# Patient Record
Sex: Female | Born: 1935 | Race: White | Hispanic: No | Smoking: Never smoker
Health system: Southern US, Community
[De-identification: ages and names within clinical notes are randomized; demographics above are authoritative.]

## PROBLEM LIST (undated history)

## (undated) DIAGNOSIS — R0609 Other forms of dyspnea: Secondary | ICD-10-CM

## (undated) DIAGNOSIS — Z9889 Other specified postprocedural states: Secondary | ICD-10-CM

## (undated) DIAGNOSIS — M1990 Unspecified osteoarthritis, unspecified site: Secondary | ICD-10-CM

## (undated) DIAGNOSIS — E785 Hyperlipidemia, unspecified: Secondary | ICD-10-CM

## (undated) DIAGNOSIS — I119 Hypertensive heart disease without heart failure: Secondary | ICD-10-CM

## (undated) DIAGNOSIS — Z862 Personal history of diseases of the blood and blood-forming organs and certain disorders involving the immune mechanism: Secondary | ICD-10-CM

## (undated) DIAGNOSIS — T8859XA Other complications of anesthesia, initial encounter: Secondary | ICD-10-CM

## (undated) DIAGNOSIS — I2510 Atherosclerotic heart disease of native coronary artery without angina pectoris: Secondary | ICD-10-CM

## (undated) DIAGNOSIS — F325 Major depressive disorder, single episode, in full remission: Secondary | ICD-10-CM

## (undated) DIAGNOSIS — R002 Palpitations: Secondary | ICD-10-CM

## (undated) DIAGNOSIS — T4145XA Adverse effect of unspecified anesthetic, initial encounter: Secondary | ICD-10-CM

## (undated) DIAGNOSIS — A4901 Methicillin susceptible Staphylococcus aureus infection, unspecified site: Secondary | ICD-10-CM

## (undated) DIAGNOSIS — I712 Thoracic aortic aneurysm, without rupture: Secondary | ICD-10-CM

## (undated) DIAGNOSIS — I10 Essential (primary) hypertension: Secondary | ICD-10-CM

## (undated) DIAGNOSIS — R112 Nausea with vomiting, unspecified: Secondary | ICD-10-CM

## (undated) DIAGNOSIS — R55 Syncope and collapse: Secondary | ICD-10-CM

## (undated) DIAGNOSIS — C4491 Basal cell carcinoma of skin, unspecified: Secondary | ICD-10-CM

## (undated) DIAGNOSIS — F419 Anxiety disorder, unspecified: Secondary | ICD-10-CM

## (undated) HISTORY — DX: Hypertensive heart disease without heart failure: I11.9

## (undated) HISTORY — DX: Palpitations: R00.2

## (undated) HISTORY — PX: BASAL CELL CARCINOMA EXCISION: SHX1214

## (undated) HISTORY — DX: Syncope and collapse: R55

## (undated) HISTORY — PX: BLADDER SUSPENSION: SHX72

## (undated) HISTORY — DX: Atherosclerotic heart disease of native coronary artery without angina pectoris: I25.10

## (undated) HISTORY — PX: OTHER SURGICAL HISTORY: SHX169

## (undated) HISTORY — PX: UPPER GI ENDOSCOPY: SHX6162

## (undated) HISTORY — DX: Anxiety disorder, unspecified: F41.9

## (undated) HISTORY — PX: COLONOSCOPY: SHX174

## (undated) HISTORY — PX: NASAL SINUS SURGERY: SHX719

## (undated) HISTORY — DX: Other forms of dyspnea: R06.09

## (undated) HISTORY — DX: Thoracic aortic aneurysm, without rupture: I71.2

## (undated) HISTORY — DX: Essential (primary) hypertension: I10

## (undated) HISTORY — PX: ABDOMINAL HYSTERECTOMY: SHX81

## (undated) HISTORY — DX: Major depressive disorder, single episode, in full remission: F32.5

## (undated) HISTORY — DX: Hyperlipidemia, unspecified: E78.5

---
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
## 2005-04-04 HISTORY — PX: FRACTURE SURGERY: SHX138

## 2013-01-03 DIAGNOSIS — I712 Thoracic aortic aneurysm, without rupture, unspecified: Secondary | ICD-10-CM

## 2013-01-03 HISTORY — DX: Thoracic aortic aneurysm, without rupture, unspecified: I71.20

## 2013-01-03 HISTORY — DX: Thoracic aortic aneurysm, without rupture: I71.2

## 2015-05-20 DIAGNOSIS — L821 Other seborrheic keratosis: Secondary | ICD-10-CM | POA: Diagnosis not present

## 2015-05-20 DIAGNOSIS — C44319 Basal cell carcinoma of skin of other parts of face: Secondary | ICD-10-CM | POA: Diagnosis not present

## 2015-05-20 DIAGNOSIS — C44529 Squamous cell carcinoma of skin of other part of trunk: Secondary | ICD-10-CM | POA: Diagnosis not present

## 2015-05-20 DIAGNOSIS — L739 Follicular disorder, unspecified: Secondary | ICD-10-CM | POA: Diagnosis not present

## 2015-05-20 DIAGNOSIS — D225 Melanocytic nevi of trunk: Secondary | ICD-10-CM | POA: Diagnosis not present

## 2015-05-20 DIAGNOSIS — L820 Inflamed seborrheic keratosis: Secondary | ICD-10-CM | POA: Diagnosis not present

## 2015-05-21 DIAGNOSIS — I119 Hypertensive heart disease without heart failure: Secondary | ICD-10-CM

## 2015-05-21 DIAGNOSIS — E785 Hyperlipidemia, unspecified: Secondary | ICD-10-CM

## 2015-05-21 DIAGNOSIS — F419 Anxiety disorder, unspecified: Secondary | ICD-10-CM | POA: Insufficient documentation

## 2015-05-21 DIAGNOSIS — F325 Major depressive disorder, single episode, in full remission: Secondary | ICD-10-CM

## 2015-05-21 DIAGNOSIS — I2510 Atherosclerotic heart disease of native coronary artery without angina pectoris: Secondary | ICD-10-CM

## 2015-05-21 HISTORY — DX: Hyperlipidemia, unspecified: E78.5

## 2015-05-21 HISTORY — DX: Anxiety disorder, unspecified: F41.9

## 2015-05-21 HISTORY — DX: Hypertensive heart disease without heart failure: I11.9

## 2015-05-21 HISTORY — DX: Atherosclerotic heart disease of native coronary artery without angina pectoris: I25.10

## 2015-05-21 HISTORY — DX: Major depressive disorder, single episode, in full remission: F32.5

## 2015-05-22 DIAGNOSIS — R220 Localized swelling, mass and lump, head: Secondary | ICD-10-CM | POA: Diagnosis not present

## 2015-05-22 DIAGNOSIS — R51 Headache: Secondary | ICD-10-CM | POA: Diagnosis not present

## 2015-06-04 DIAGNOSIS — J320 Chronic maxillary sinusitis: Secondary | ICD-10-CM | POA: Diagnosis not present

## 2015-06-04 DIAGNOSIS — J342 Deviated nasal septum: Secondary | ICD-10-CM | POA: Diagnosis not present

## 2015-06-04 DIAGNOSIS — R220 Localized swelling, mass and lump, head: Secondary | ICD-10-CM | POA: Diagnosis not present

## 2015-06-17 DIAGNOSIS — J320 Chronic maxillary sinusitis: Secondary | ICD-10-CM | POA: Diagnosis not present

## 2015-06-29 DIAGNOSIS — J343 Hypertrophy of nasal turbinates: Secondary | ICD-10-CM | POA: Diagnosis not present

## 2015-06-29 DIAGNOSIS — J320 Chronic maxillary sinusitis: Secondary | ICD-10-CM | POA: Diagnosis not present

## 2015-06-29 DIAGNOSIS — J342 Deviated nasal septum: Secondary | ICD-10-CM | POA: Diagnosis not present

## 2015-07-01 DIAGNOSIS — J320 Chronic maxillary sinusitis: Secondary | ICD-10-CM | POA: Diagnosis not present

## 2015-07-01 DIAGNOSIS — R220 Localized swelling, mass and lump, head: Secondary | ICD-10-CM | POA: Diagnosis not present

## 2015-07-24 DIAGNOSIS — Z01810 Encounter for preprocedural cardiovascular examination: Secondary | ICD-10-CM | POA: Diagnosis not present

## 2015-07-24 DIAGNOSIS — Z01818 Encounter for other preprocedural examination: Secondary | ICD-10-CM | POA: Diagnosis not present

## 2015-07-24 DIAGNOSIS — I10 Essential (primary) hypertension: Secondary | ICD-10-CM | POA: Diagnosis not present

## 2015-07-29 DIAGNOSIS — I712 Thoracic aortic aneurysm, without rupture: Secondary | ICD-10-CM | POA: Diagnosis not present

## 2015-07-29 DIAGNOSIS — I2510 Atherosclerotic heart disease of native coronary artery without angina pectoris: Secondary | ICD-10-CM | POA: Diagnosis not present

## 2015-07-29 DIAGNOSIS — I119 Hypertensive heart disease without heart failure: Secondary | ICD-10-CM | POA: Diagnosis not present

## 2015-08-03 DIAGNOSIS — I719 Aortic aneurysm of unspecified site, without rupture: Secondary | ICD-10-CM | POA: Diagnosis not present

## 2015-08-03 DIAGNOSIS — I714 Abdominal aortic aneurysm, without rupture: Secondary | ICD-10-CM | POA: Diagnosis not present

## 2015-09-04 DIAGNOSIS — J320 Chronic maxillary sinusitis: Secondary | ICD-10-CM | POA: Diagnosis not present

## 2015-09-04 DIAGNOSIS — R0981 Nasal congestion: Secondary | ICD-10-CM | POA: Diagnosis not present

## 2015-09-04 DIAGNOSIS — J343 Hypertrophy of nasal turbinates: Secondary | ICD-10-CM | POA: Diagnosis not present

## 2015-09-04 DIAGNOSIS — J342 Deviated nasal septum: Secondary | ICD-10-CM | POA: Diagnosis not present

## 2015-09-07 DIAGNOSIS — I10 Essential (primary) hypertension: Secondary | ICD-10-CM | POA: Diagnosis not present

## 2015-09-07 DIAGNOSIS — I712 Thoracic aortic aneurysm, without rupture: Secondary | ICD-10-CM | POA: Diagnosis not present

## 2015-09-07 DIAGNOSIS — Z298 Encounter for other specified prophylactic measures: Secondary | ICD-10-CM | POA: Diagnosis not present

## 2015-09-29 DIAGNOSIS — J320 Chronic maxillary sinusitis: Secondary | ICD-10-CM | POA: Diagnosis not present

## 2015-09-29 DIAGNOSIS — Z79899 Other long term (current) drug therapy: Secondary | ICD-10-CM | POA: Diagnosis not present

## 2015-09-29 DIAGNOSIS — Z7982 Long term (current) use of aspirin: Secondary | ICD-10-CM | POA: Diagnosis not present

## 2015-09-29 DIAGNOSIS — J342 Deviated nasal septum: Secondary | ICD-10-CM | POA: Diagnosis not present

## 2015-09-29 DIAGNOSIS — E7800 Pure hypercholesterolemia, unspecified: Secondary | ICD-10-CM | POA: Diagnosis not present

## 2015-09-29 DIAGNOSIS — R0981 Nasal congestion: Secondary | ICD-10-CM | POA: Diagnosis not present

## 2015-09-29 DIAGNOSIS — I10 Essential (primary) hypertension: Secondary | ICD-10-CM | POA: Diagnosis not present

## 2015-09-29 DIAGNOSIS — J3489 Other specified disorders of nose and nasal sinuses: Secondary | ICD-10-CM | POA: Diagnosis not present

## 2015-09-29 DIAGNOSIS — J343 Hypertrophy of nasal turbinates: Secondary | ICD-10-CM | POA: Diagnosis not present

## 2015-09-30 DIAGNOSIS — Z09 Encounter for follow-up examination after completed treatment for conditions other than malignant neoplasm: Secondary | ICD-10-CM | POA: Diagnosis not present

## 2015-10-07 DIAGNOSIS — J329 Chronic sinusitis, unspecified: Secondary | ICD-10-CM | POA: Diagnosis not present

## 2015-10-14 DIAGNOSIS — J329 Chronic sinusitis, unspecified: Secondary | ICD-10-CM | POA: Diagnosis not present

## 2015-10-19 DIAGNOSIS — J320 Chronic maxillary sinusitis: Secondary | ICD-10-CM | POA: Diagnosis not present

## 2015-10-27 DIAGNOSIS — Z1231 Encounter for screening mammogram for malignant neoplasm of breast: Secondary | ICD-10-CM | POA: Diagnosis not present

## 2016-01-01 DIAGNOSIS — Z79899 Other long term (current) drug therapy: Secondary | ICD-10-CM | POA: Diagnosis not present

## 2016-01-01 DIAGNOSIS — Z23 Encounter for immunization: Secondary | ICD-10-CM | POA: Diagnosis not present

## 2016-01-01 DIAGNOSIS — Z0001 Encounter for general adult medical examination with abnormal findings: Secondary | ICD-10-CM | POA: Diagnosis not present

## 2016-01-01 DIAGNOSIS — R21 Rash and other nonspecific skin eruption: Secondary | ICD-10-CM | POA: Diagnosis not present

## 2016-01-01 DIAGNOSIS — I119 Hypertensive heart disease without heart failure: Secondary | ICD-10-CM | POA: Diagnosis not present

## 2016-01-01 DIAGNOSIS — D492 Neoplasm of unspecified behavior of bone, soft tissue, and skin: Secondary | ICD-10-CM | POA: Diagnosis not present

## 2016-01-05 DIAGNOSIS — J0100 Acute maxillary sinusitis, unspecified: Secondary | ICD-10-CM | POA: Diagnosis not present

## 2016-01-05 DIAGNOSIS — J029 Acute pharyngitis, unspecified: Secondary | ICD-10-CM | POA: Diagnosis not present

## 2016-01-21 DIAGNOSIS — R51 Headache: Secondary | ICD-10-CM | POA: Diagnosis not present

## 2016-01-21 DIAGNOSIS — Z9889 Other specified postprocedural states: Secondary | ICD-10-CM | POA: Diagnosis not present

## 2016-01-21 DIAGNOSIS — J329 Chronic sinusitis, unspecified: Secondary | ICD-10-CM | POA: Diagnosis not present

## 2016-02-12 DIAGNOSIS — I10 Essential (primary) hypertension: Secondary | ICD-10-CM | POA: Diagnosis not present

## 2016-02-12 DIAGNOSIS — D485 Neoplasm of uncertain behavior of skin: Secondary | ICD-10-CM | POA: Diagnosis not present

## 2016-03-09 DIAGNOSIS — F325 Major depressive disorder, single episode, in full remission: Secondary | ICD-10-CM | POA: Diagnosis not present

## 2016-03-09 DIAGNOSIS — E782 Mixed hyperlipidemia: Secondary | ICD-10-CM | POA: Diagnosis not present

## 2016-03-09 DIAGNOSIS — I2510 Atherosclerotic heart disease of native coronary artery without angina pectoris: Secondary | ICD-10-CM | POA: Diagnosis not present

## 2016-03-09 DIAGNOSIS — I119 Hypertensive heart disease without heart failure: Secondary | ICD-10-CM | POA: Diagnosis not present

## 2016-03-09 DIAGNOSIS — Z79899 Other long term (current) drug therapy: Secondary | ICD-10-CM | POA: Diagnosis not present

## 2016-05-10 DIAGNOSIS — I119 Hypertensive heart disease without heart failure: Secondary | ICD-10-CM | POA: Diagnosis not present

## 2016-05-10 DIAGNOSIS — I712 Thoracic aortic aneurysm, without rupture: Secondary | ICD-10-CM | POA: Diagnosis not present

## 2016-05-10 DIAGNOSIS — I2510 Atherosclerotic heart disease of native coronary artery without angina pectoris: Secondary | ICD-10-CM | POA: Diagnosis not present

## 2016-05-10 DIAGNOSIS — Z79899 Other long term (current) drug therapy: Secondary | ICD-10-CM | POA: Diagnosis not present

## 2016-05-10 DIAGNOSIS — R42 Dizziness and giddiness: Secondary | ICD-10-CM | POA: Diagnosis not present

## 2016-05-19 DIAGNOSIS — D2239 Melanocytic nevi of other parts of face: Secondary | ICD-10-CM | POA: Diagnosis not present

## 2016-05-19 DIAGNOSIS — D225 Melanocytic nevi of trunk: Secondary | ICD-10-CM | POA: Diagnosis not present

## 2016-05-19 DIAGNOSIS — L821 Other seborrheic keratosis: Secondary | ICD-10-CM | POA: Diagnosis not present

## 2016-05-19 DIAGNOSIS — L820 Inflamed seborrheic keratosis: Secondary | ICD-10-CM | POA: Diagnosis not present

## 2016-05-19 DIAGNOSIS — L814 Other melanin hyperpigmentation: Secondary | ICD-10-CM | POA: Diagnosis not present

## 2016-06-23 DIAGNOSIS — R0609 Other forms of dyspnea: Secondary | ICD-10-CM

## 2016-06-23 DIAGNOSIS — R002 Palpitations: Secondary | ICD-10-CM

## 2016-06-23 DIAGNOSIS — R55 Syncope and collapse: Secondary | ICD-10-CM | POA: Insufficient documentation

## 2016-06-23 DIAGNOSIS — I10 Essential (primary) hypertension: Secondary | ICD-10-CM

## 2016-06-23 DIAGNOSIS — R0600 Dyspnea, unspecified: Secondary | ICD-10-CM

## 2016-06-23 HISTORY — DX: Syncope and collapse: R55

## 2016-06-23 HISTORY — DX: Dyspnea, unspecified: R06.00

## 2016-06-23 HISTORY — DX: Essential (primary) hypertension: I10

## 2016-06-23 HISTORY — DX: Other forms of dyspnea: R06.09

## 2016-06-23 HISTORY — DX: Palpitations: R00.2

## 2016-07-06 DIAGNOSIS — R002 Palpitations: Secondary | ICD-10-CM | POA: Diagnosis not present

## 2016-07-06 DIAGNOSIS — R0609 Other forms of dyspnea: Secondary | ICD-10-CM | POA: Diagnosis not present

## 2016-07-06 DIAGNOSIS — R55 Syncope and collapse: Secondary | ICD-10-CM | POA: Diagnosis not present

## 2016-07-06 DIAGNOSIS — I10 Essential (primary) hypertension: Secondary | ICD-10-CM | POA: Diagnosis not present

## 2016-07-11 DIAGNOSIS — M85852 Other specified disorders of bone density and structure, left thigh: Secondary | ICD-10-CM | POA: Diagnosis not present

## 2016-07-11 DIAGNOSIS — M85851 Other specified disorders of bone density and structure, right thigh: Secondary | ICD-10-CM | POA: Diagnosis not present

## 2016-07-11 DIAGNOSIS — S8002XA Contusion of left knee, initial encounter: Secondary | ICD-10-CM | POA: Diagnosis not present

## 2016-07-11 DIAGNOSIS — S8012XA Contusion of left lower leg, initial encounter: Secondary | ICD-10-CM | POA: Diagnosis not present

## 2016-07-12 DIAGNOSIS — M1712 Unilateral primary osteoarthritis, left knee: Secondary | ICD-10-CM | POA: Diagnosis not present

## 2016-08-02 DIAGNOSIS — M4315 Spondylolisthesis, thoracolumbar region: Secondary | ICD-10-CM | POA: Diagnosis not present

## 2016-08-02 DIAGNOSIS — M9903 Segmental and somatic dysfunction of lumbar region: Secondary | ICD-10-CM | POA: Diagnosis not present

## 2016-08-02 DIAGNOSIS — M9905 Segmental and somatic dysfunction of pelvic region: Secondary | ICD-10-CM | POA: Diagnosis not present

## 2016-08-02 DIAGNOSIS — M5432 Sciatica, left side: Secondary | ICD-10-CM | POA: Diagnosis not present

## 2016-08-02 DIAGNOSIS — M5388 Other specified dorsopathies, sacral and sacrococcygeal region: Secondary | ICD-10-CM | POA: Diagnosis not present

## 2016-08-02 DIAGNOSIS — M9904 Segmental and somatic dysfunction of sacral region: Secondary | ICD-10-CM | POA: Diagnosis not present

## 2016-08-04 DIAGNOSIS — M5388 Other specified dorsopathies, sacral and sacrococcygeal region: Secondary | ICD-10-CM | POA: Diagnosis not present

## 2016-08-04 DIAGNOSIS — M9905 Segmental and somatic dysfunction of pelvic region: Secondary | ICD-10-CM | POA: Diagnosis not present

## 2016-08-04 DIAGNOSIS — I10 Essential (primary) hypertension: Secondary | ICD-10-CM | POA: Diagnosis not present

## 2016-08-04 DIAGNOSIS — M5432 Sciatica, left side: Secondary | ICD-10-CM | POA: Diagnosis not present

## 2016-08-04 DIAGNOSIS — M9903 Segmental and somatic dysfunction of lumbar region: Secondary | ICD-10-CM | POA: Diagnosis not present

## 2016-08-04 DIAGNOSIS — R002 Palpitations: Secondary | ICD-10-CM | POA: Diagnosis not present

## 2016-08-04 DIAGNOSIS — M4315 Spondylolisthesis, thoracolumbar region: Secondary | ICD-10-CM | POA: Diagnosis not present

## 2016-08-04 DIAGNOSIS — M9904 Segmental and somatic dysfunction of sacral region: Secondary | ICD-10-CM | POA: Diagnosis not present

## 2016-08-04 DIAGNOSIS — R0609 Other forms of dyspnea: Secondary | ICD-10-CM | POA: Diagnosis not present

## 2016-08-04 DIAGNOSIS — R55 Syncope and collapse: Secondary | ICD-10-CM | POA: Diagnosis not present

## 2016-08-08 DIAGNOSIS — M4310 Spondylolisthesis, site unspecified: Secondary | ICD-10-CM | POA: Diagnosis not present

## 2016-08-08 DIAGNOSIS — M545 Low back pain: Secondary | ICD-10-CM | POA: Diagnosis not present

## 2016-08-08 DIAGNOSIS — I119 Hypertensive heart disease without heart failure: Secondary | ICD-10-CM | POA: Diagnosis not present

## 2016-08-08 DIAGNOSIS — Z23 Encounter for immunization: Secondary | ICD-10-CM | POA: Diagnosis not present

## 2016-08-08 DIAGNOSIS — M4316 Spondylolisthesis, lumbar region: Secondary | ICD-10-CM | POA: Diagnosis not present

## 2016-08-09 DIAGNOSIS — M5432 Sciatica, left side: Secondary | ICD-10-CM | POA: Diagnosis not present

## 2016-08-09 DIAGNOSIS — M9904 Segmental and somatic dysfunction of sacral region: Secondary | ICD-10-CM | POA: Diagnosis not present

## 2016-08-09 DIAGNOSIS — M9905 Segmental and somatic dysfunction of pelvic region: Secondary | ICD-10-CM | POA: Diagnosis not present

## 2016-08-09 DIAGNOSIS — M9903 Segmental and somatic dysfunction of lumbar region: Secondary | ICD-10-CM | POA: Diagnosis not present

## 2016-08-09 DIAGNOSIS — M4315 Spondylolisthesis, thoracolumbar region: Secondary | ICD-10-CM | POA: Diagnosis not present

## 2016-08-09 DIAGNOSIS — M5388 Other specified dorsopathies, sacral and sacrococcygeal region: Secondary | ICD-10-CM | POA: Diagnosis not present

## 2016-08-15 DIAGNOSIS — M4315 Spondylolisthesis, thoracolumbar region: Secondary | ICD-10-CM | POA: Diagnosis not present

## 2016-08-15 DIAGNOSIS — M9904 Segmental and somatic dysfunction of sacral region: Secondary | ICD-10-CM | POA: Diagnosis not present

## 2016-08-15 DIAGNOSIS — M9905 Segmental and somatic dysfunction of pelvic region: Secondary | ICD-10-CM | POA: Diagnosis not present

## 2016-08-15 DIAGNOSIS — M5388 Other specified dorsopathies, sacral and sacrococcygeal region: Secondary | ICD-10-CM | POA: Diagnosis not present

## 2016-08-15 DIAGNOSIS — M9903 Segmental and somatic dysfunction of lumbar region: Secondary | ICD-10-CM | POA: Diagnosis not present

## 2016-08-15 DIAGNOSIS — M5432 Sciatica, left side: Secondary | ICD-10-CM | POA: Diagnosis not present

## 2016-08-22 DIAGNOSIS — I712 Thoracic aortic aneurysm, without rupture: Secondary | ICD-10-CM | POA: Diagnosis not present

## 2016-08-22 DIAGNOSIS — R002 Palpitations: Secondary | ICD-10-CM | POA: Diagnosis not present

## 2016-08-22 DIAGNOSIS — I10 Essential (primary) hypertension: Secondary | ICD-10-CM | POA: Diagnosis not present

## 2016-08-22 DIAGNOSIS — R0609 Other forms of dyspnea: Secondary | ICD-10-CM | POA: Diagnosis not present

## 2016-08-22 DIAGNOSIS — R55 Syncope and collapse: Secondary | ICD-10-CM | POA: Diagnosis not present

## 2016-08-31 DIAGNOSIS — I712 Thoracic aortic aneurysm, without rupture: Secondary | ICD-10-CM | POA: Diagnosis not present

## 2016-08-31 DIAGNOSIS — R002 Palpitations: Secondary | ICD-10-CM | POA: Diagnosis not present

## 2016-08-31 DIAGNOSIS — R0609 Other forms of dyspnea: Secondary | ICD-10-CM | POA: Diagnosis not present

## 2016-08-31 DIAGNOSIS — R55 Syncope and collapse: Secondary | ICD-10-CM | POA: Diagnosis not present

## 2016-08-31 DIAGNOSIS — I10 Essential (primary) hypertension: Secondary | ICD-10-CM | POA: Diagnosis not present

## 2016-09-01 DIAGNOSIS — M5432 Sciatica, left side: Secondary | ICD-10-CM | POA: Diagnosis not present

## 2016-09-01 DIAGNOSIS — M9905 Segmental and somatic dysfunction of pelvic region: Secondary | ICD-10-CM | POA: Diagnosis not present

## 2016-09-01 DIAGNOSIS — M9904 Segmental and somatic dysfunction of sacral region: Secondary | ICD-10-CM | POA: Diagnosis not present

## 2016-09-01 DIAGNOSIS — M5388 Other specified dorsopathies, sacral and sacrococcygeal region: Secondary | ICD-10-CM | POA: Diagnosis not present

## 2016-09-01 DIAGNOSIS — M9903 Segmental and somatic dysfunction of lumbar region: Secondary | ICD-10-CM | POA: Diagnosis not present

## 2016-09-01 DIAGNOSIS — M4315 Spondylolisthesis, thoracolumbar region: Secondary | ICD-10-CM | POA: Diagnosis not present

## 2016-09-06 DIAGNOSIS — M5432 Sciatica, left side: Secondary | ICD-10-CM | POA: Diagnosis not present

## 2016-09-06 DIAGNOSIS — M5388 Other specified dorsopathies, sacral and sacrococcygeal region: Secondary | ICD-10-CM | POA: Diagnosis not present

## 2016-09-06 DIAGNOSIS — M9905 Segmental and somatic dysfunction of pelvic region: Secondary | ICD-10-CM | POA: Diagnosis not present

## 2016-09-06 DIAGNOSIS — M9903 Segmental and somatic dysfunction of lumbar region: Secondary | ICD-10-CM | POA: Diagnosis not present

## 2016-09-06 DIAGNOSIS — M4315 Spondylolisthesis, thoracolumbar region: Secondary | ICD-10-CM | POA: Diagnosis not present

## 2016-09-06 DIAGNOSIS — M9904 Segmental and somatic dysfunction of sacral region: Secondary | ICD-10-CM | POA: Diagnosis not present

## 2016-09-14 DIAGNOSIS — M5416 Radiculopathy, lumbar region: Secondary | ICD-10-CM | POA: Diagnosis not present

## 2016-09-14 DIAGNOSIS — M7062 Trochanteric bursitis, left hip: Secondary | ICD-10-CM | POA: Diagnosis not present

## 2016-09-16 DIAGNOSIS — M5432 Sciatica, left side: Secondary | ICD-10-CM | POA: Diagnosis not present

## 2016-09-16 DIAGNOSIS — M9903 Segmental and somatic dysfunction of lumbar region: Secondary | ICD-10-CM | POA: Diagnosis not present

## 2016-09-16 DIAGNOSIS — M4126 Other idiopathic scoliosis, lumbar region: Secondary | ICD-10-CM | POA: Diagnosis not present

## 2016-09-16 DIAGNOSIS — M9904 Segmental and somatic dysfunction of sacral region: Secondary | ICD-10-CM | POA: Diagnosis not present

## 2016-09-16 DIAGNOSIS — M9905 Segmental and somatic dysfunction of pelvic region: Secondary | ICD-10-CM | POA: Diagnosis not present

## 2016-09-16 DIAGNOSIS — M4727 Other spondylosis with radiculopathy, lumbosacral region: Secondary | ICD-10-CM | POA: Diagnosis not present

## 2016-09-19 DIAGNOSIS — Z0000 Encounter for general adult medical examination without abnormal findings: Secondary | ICD-10-CM | POA: Diagnosis not present

## 2016-09-28 DIAGNOSIS — M9904 Segmental and somatic dysfunction of sacral region: Secondary | ICD-10-CM | POA: Diagnosis not present

## 2016-09-28 DIAGNOSIS — M5432 Sciatica, left side: Secondary | ICD-10-CM | POA: Diagnosis not present

## 2016-09-28 DIAGNOSIS — M9903 Segmental and somatic dysfunction of lumbar region: Secondary | ICD-10-CM | POA: Diagnosis not present

## 2016-09-28 DIAGNOSIS — M4727 Other spondylosis with radiculopathy, lumbosacral region: Secondary | ICD-10-CM | POA: Diagnosis not present

## 2016-09-28 DIAGNOSIS — M9905 Segmental and somatic dysfunction of pelvic region: Secondary | ICD-10-CM | POA: Diagnosis not present

## 2016-09-28 DIAGNOSIS — M4126 Other idiopathic scoliosis, lumbar region: Secondary | ICD-10-CM | POA: Diagnosis not present

## 2016-10-03 DIAGNOSIS — M9903 Segmental and somatic dysfunction of lumbar region: Secondary | ICD-10-CM | POA: Diagnosis not present

## 2016-10-03 DIAGNOSIS — M5432 Sciatica, left side: Secondary | ICD-10-CM | POA: Diagnosis not present

## 2016-10-03 DIAGNOSIS — M9904 Segmental and somatic dysfunction of sacral region: Secondary | ICD-10-CM | POA: Diagnosis not present

## 2016-10-03 DIAGNOSIS — M4126 Other idiopathic scoliosis, lumbar region: Secondary | ICD-10-CM | POA: Diagnosis not present

## 2016-10-03 DIAGNOSIS — M4727 Other spondylosis with radiculopathy, lumbosacral region: Secondary | ICD-10-CM | POA: Diagnosis not present

## 2016-10-03 DIAGNOSIS — M9905 Segmental and somatic dysfunction of pelvic region: Secondary | ICD-10-CM | POA: Diagnosis not present

## 2016-10-13 DIAGNOSIS — M48061 Spinal stenosis, lumbar region without neurogenic claudication: Secondary | ICD-10-CM | POA: Diagnosis not present

## 2016-10-13 DIAGNOSIS — M5416 Radiculopathy, lumbar region: Secondary | ICD-10-CM | POA: Diagnosis not present

## 2016-10-13 DIAGNOSIS — M5126 Other intervertebral disc displacement, lumbar region: Secondary | ICD-10-CM | POA: Diagnosis not present

## 2016-10-14 DIAGNOSIS — M5416 Radiculopathy, lumbar region: Secondary | ICD-10-CM | POA: Diagnosis not present

## 2016-10-18 DIAGNOSIS — M9903 Segmental and somatic dysfunction of lumbar region: Secondary | ICD-10-CM | POA: Diagnosis not present

## 2016-10-18 DIAGNOSIS — M4727 Other spondylosis with radiculopathy, lumbosacral region: Secondary | ICD-10-CM | POA: Diagnosis not present

## 2016-10-18 DIAGNOSIS — M5116 Intervertebral disc disorders with radiculopathy, lumbar region: Secondary | ICD-10-CM | POA: Diagnosis not present

## 2016-10-18 DIAGNOSIS — M5432 Sciatica, left side: Secondary | ICD-10-CM | POA: Diagnosis not present

## 2016-10-18 DIAGNOSIS — M9904 Segmental and somatic dysfunction of sacral region: Secondary | ICD-10-CM | POA: Diagnosis not present

## 2016-10-18 DIAGNOSIS — M9905 Segmental and somatic dysfunction of pelvic region: Secondary | ICD-10-CM | POA: Diagnosis not present

## 2016-10-21 DIAGNOSIS — M5116 Intervertebral disc disorders with radiculopathy, lumbar region: Secondary | ICD-10-CM | POA: Diagnosis not present

## 2016-10-21 DIAGNOSIS — M4727 Other spondylosis with radiculopathy, lumbosacral region: Secondary | ICD-10-CM | POA: Diagnosis not present

## 2016-10-21 DIAGNOSIS — M9903 Segmental and somatic dysfunction of lumbar region: Secondary | ICD-10-CM | POA: Diagnosis not present

## 2016-10-21 DIAGNOSIS — M9904 Segmental and somatic dysfunction of sacral region: Secondary | ICD-10-CM | POA: Diagnosis not present

## 2016-10-21 DIAGNOSIS — M5432 Sciatica, left side: Secondary | ICD-10-CM | POA: Diagnosis not present

## 2016-10-21 DIAGNOSIS — M9905 Segmental and somatic dysfunction of pelvic region: Secondary | ICD-10-CM | POA: Diagnosis not present

## 2016-12-12 DIAGNOSIS — F325 Major depressive disorder, single episode, in full remission: Secondary | ICD-10-CM | POA: Diagnosis not present

## 2016-12-12 DIAGNOSIS — I2510 Atherosclerotic heart disease of native coronary artery without angina pectoris: Secondary | ICD-10-CM | POA: Diagnosis not present

## 2016-12-12 DIAGNOSIS — Z23 Encounter for immunization: Secondary | ICD-10-CM | POA: Diagnosis not present

## 2016-12-12 DIAGNOSIS — Z79899 Other long term (current) drug therapy: Secondary | ICD-10-CM | POA: Diagnosis not present

## 2016-12-12 DIAGNOSIS — I119 Hypertensive heart disease without heart failure: Secondary | ICD-10-CM | POA: Diagnosis not present

## 2016-12-12 DIAGNOSIS — I712 Thoracic aortic aneurysm, without rupture: Secondary | ICD-10-CM | POA: Diagnosis not present

## 2016-12-20 DIAGNOSIS — Z1231 Encounter for screening mammogram for malignant neoplasm of breast: Secondary | ICD-10-CM | POA: Diagnosis not present

## 2017-01-09 DIAGNOSIS — M4310 Spondylolisthesis, site unspecified: Secondary | ICD-10-CM | POA: Diagnosis not present

## 2017-01-09 DIAGNOSIS — M48061 Spinal stenosis, lumbar region without neurogenic claudication: Secondary | ICD-10-CM | POA: Diagnosis not present

## 2017-01-09 DIAGNOSIS — F325 Major depressive disorder, single episode, in full remission: Secondary | ICD-10-CM | POA: Diagnosis not present

## 2017-01-09 DIAGNOSIS — I2510 Atherosclerotic heart disease of native coronary artery without angina pectoris: Secondary | ICD-10-CM | POA: Diagnosis not present

## 2017-01-09 DIAGNOSIS — I712 Thoracic aortic aneurysm, without rupture: Secondary | ICD-10-CM | POA: Diagnosis not present

## 2017-01-09 DIAGNOSIS — I119 Hypertensive heart disease without heart failure: Secondary | ICD-10-CM | POA: Diagnosis not present

## 2017-01-09 DIAGNOSIS — Z23 Encounter for immunization: Secondary | ICD-10-CM | POA: Diagnosis not present

## 2017-01-23 DIAGNOSIS — M5416 Radiculopathy, lumbar region: Secondary | ICD-10-CM | POA: Diagnosis not present

## 2017-01-23 DIAGNOSIS — M4807 Spinal stenosis, lumbosacral region: Secondary | ICD-10-CM | POA: Diagnosis not present

## 2017-01-23 DIAGNOSIS — M4316 Spondylolisthesis, lumbar region: Secondary | ICD-10-CM | POA: Diagnosis not present

## 2017-01-30 DIAGNOSIS — M6281 Muscle weakness (generalized): Secondary | ICD-10-CM | POA: Diagnosis not present

## 2017-01-30 DIAGNOSIS — M545 Low back pain: Secondary | ICD-10-CM | POA: Diagnosis not present

## 2017-01-30 DIAGNOSIS — R2689 Other abnormalities of gait and mobility: Secondary | ICD-10-CM | POA: Diagnosis not present

## 2017-02-02 DIAGNOSIS — R2689 Other abnormalities of gait and mobility: Secondary | ICD-10-CM | POA: Diagnosis not present

## 2017-02-02 DIAGNOSIS — M6281 Muscle weakness (generalized): Secondary | ICD-10-CM | POA: Diagnosis not present

## 2017-02-02 DIAGNOSIS — M545 Low back pain: Secondary | ICD-10-CM | POA: Diagnosis not present

## 2017-02-06 DIAGNOSIS — R2689 Other abnormalities of gait and mobility: Secondary | ICD-10-CM | POA: Diagnosis not present

## 2017-02-06 DIAGNOSIS — M545 Low back pain: Secondary | ICD-10-CM | POA: Diagnosis not present

## 2017-02-06 DIAGNOSIS — M6281 Muscle weakness (generalized): Secondary | ICD-10-CM | POA: Diagnosis not present

## 2017-02-09 DIAGNOSIS — R2689 Other abnormalities of gait and mobility: Secondary | ICD-10-CM | POA: Diagnosis not present

## 2017-02-09 DIAGNOSIS — M545 Low back pain: Secondary | ICD-10-CM | POA: Diagnosis not present

## 2017-02-09 DIAGNOSIS — M6281 Muscle weakness (generalized): Secondary | ICD-10-CM | POA: Diagnosis not present

## 2017-02-13 DIAGNOSIS — M545 Low back pain: Secondary | ICD-10-CM | POA: Diagnosis not present

## 2017-02-13 DIAGNOSIS — M6281 Muscle weakness (generalized): Secondary | ICD-10-CM | POA: Diagnosis not present

## 2017-02-13 DIAGNOSIS — R2689 Other abnormalities of gait and mobility: Secondary | ICD-10-CM | POA: Diagnosis not present

## 2017-02-16 DIAGNOSIS — R2689 Other abnormalities of gait and mobility: Secondary | ICD-10-CM | POA: Diagnosis not present

## 2017-02-16 DIAGNOSIS — M6281 Muscle weakness (generalized): Secondary | ICD-10-CM | POA: Diagnosis not present

## 2017-02-16 DIAGNOSIS — M545 Low back pain: Secondary | ICD-10-CM | POA: Diagnosis not present

## 2017-02-20 DIAGNOSIS — R2689 Other abnormalities of gait and mobility: Secondary | ICD-10-CM | POA: Diagnosis not present

## 2017-02-20 DIAGNOSIS — M6281 Muscle weakness (generalized): Secondary | ICD-10-CM | POA: Diagnosis not present

## 2017-02-20 DIAGNOSIS — M545 Low back pain: Secondary | ICD-10-CM | POA: Diagnosis not present

## 2017-02-27 DIAGNOSIS — M545 Low back pain: Secondary | ICD-10-CM | POA: Diagnosis not present

## 2017-02-27 DIAGNOSIS — R2689 Other abnormalities of gait and mobility: Secondary | ICD-10-CM | POA: Diagnosis not present

## 2017-02-27 DIAGNOSIS — M6281 Muscle weakness (generalized): Secondary | ICD-10-CM | POA: Diagnosis not present

## 2017-03-02 DIAGNOSIS — M545 Low back pain: Secondary | ICD-10-CM | POA: Diagnosis not present

## 2017-03-02 DIAGNOSIS — M6281 Muscle weakness (generalized): Secondary | ICD-10-CM | POA: Diagnosis not present

## 2017-03-02 DIAGNOSIS — R2689 Other abnormalities of gait and mobility: Secondary | ICD-10-CM | POA: Diagnosis not present

## 2017-03-09 DIAGNOSIS — M5440 Lumbago with sciatica, unspecified side: Secondary | ICD-10-CM | POA: Diagnosis not present

## 2017-03-16 DIAGNOSIS — M6281 Muscle weakness (generalized): Secondary | ICD-10-CM | POA: Diagnosis not present

## 2017-03-16 DIAGNOSIS — M545 Low back pain: Secondary | ICD-10-CM | POA: Diagnosis not present

## 2017-03-16 DIAGNOSIS — R2689 Other abnormalities of gait and mobility: Secondary | ICD-10-CM | POA: Diagnosis not present

## 2017-03-20 DIAGNOSIS — M545 Low back pain: Secondary | ICD-10-CM | POA: Diagnosis not present

## 2017-03-20 DIAGNOSIS — R2689 Other abnormalities of gait and mobility: Secondary | ICD-10-CM | POA: Diagnosis not present

## 2017-03-20 DIAGNOSIS — M6281 Muscle weakness (generalized): Secondary | ICD-10-CM | POA: Diagnosis not present

## 2017-03-23 DIAGNOSIS — R2689 Other abnormalities of gait and mobility: Secondary | ICD-10-CM | POA: Diagnosis not present

## 2017-03-23 DIAGNOSIS — M545 Low back pain: Secondary | ICD-10-CM | POA: Diagnosis not present

## 2017-03-23 DIAGNOSIS — M6281 Muscle weakness (generalized): Secondary | ICD-10-CM | POA: Diagnosis not present

## 2017-04-17 DIAGNOSIS — M1712 Unilateral primary osteoarthritis, left knee: Secondary | ICD-10-CM | POA: Diagnosis not present

## 2017-05-17 DIAGNOSIS — J0100 Acute maxillary sinusitis, unspecified: Secondary | ICD-10-CM | POA: Diagnosis not present

## 2017-07-05 DIAGNOSIS — M1712 Unilateral primary osteoarthritis, left knee: Secondary | ICD-10-CM | POA: Diagnosis not present

## 2017-07-10 DIAGNOSIS — I2510 Atherosclerotic heart disease of native coronary artery without angina pectoris: Secondary | ICD-10-CM | POA: Diagnosis not present

## 2017-07-10 DIAGNOSIS — F325 Major depressive disorder, single episode, in full remission: Secondary | ICD-10-CM | POA: Diagnosis not present

## 2017-07-10 DIAGNOSIS — I119 Hypertensive heart disease without heart failure: Secondary | ICD-10-CM | POA: Diagnosis not present

## 2017-07-10 DIAGNOSIS — E782 Mixed hyperlipidemia: Secondary | ICD-10-CM | POA: Diagnosis not present

## 2017-07-10 DIAGNOSIS — Z79899 Other long term (current) drug therapy: Secondary | ICD-10-CM | POA: Diagnosis not present

## 2017-10-09 DIAGNOSIS — M1712 Unilateral primary osteoarthritis, left knee: Secondary | ICD-10-CM | POA: Diagnosis not present

## 2018-01-08 DIAGNOSIS — Z23 Encounter for immunization: Secondary | ICD-10-CM | POA: Diagnosis not present

## 2018-01-08 DIAGNOSIS — I712 Thoracic aortic aneurysm, without rupture: Secondary | ICD-10-CM | POA: Diagnosis not present

## 2018-01-08 DIAGNOSIS — Z79899 Other long term (current) drug therapy: Secondary | ICD-10-CM | POA: Diagnosis not present

## 2018-01-08 DIAGNOSIS — I119 Hypertensive heart disease without heart failure: Secondary | ICD-10-CM | POA: Diagnosis not present

## 2018-01-08 DIAGNOSIS — F325 Major depressive disorder, single episode, in full remission: Secondary | ICD-10-CM | POA: Diagnosis not present

## 2018-01-08 DIAGNOSIS — I2510 Atherosclerotic heart disease of native coronary artery without angina pectoris: Secondary | ICD-10-CM | POA: Diagnosis not present

## 2018-01-08 DIAGNOSIS — Z0000 Encounter for general adult medical examination without abnormal findings: Secondary | ICD-10-CM | POA: Diagnosis not present

## 2018-01-16 DIAGNOSIS — Z1231 Encounter for screening mammogram for malignant neoplasm of breast: Secondary | ICD-10-CM | POA: Diagnosis not present

## 2018-02-20 ENCOUNTER — Encounter: Payer: Self-pay | Admitting: *Deleted

## 2018-02-21 ENCOUNTER — Ambulatory Visit: Payer: PPO | Admitting: Cardiology

## 2018-02-21 ENCOUNTER — Encounter: Payer: Self-pay | Admitting: Cardiology

## 2018-02-21 VITALS — BP 130/76 | HR 76 | Ht 62.0 in | Wt 131.2 lb

## 2018-02-21 DIAGNOSIS — R002 Palpitations: Secondary | ICD-10-CM | POA: Diagnosis not present

## 2018-02-21 DIAGNOSIS — I712 Thoracic aortic aneurysm, without rupture, unspecified: Secondary | ICD-10-CM

## 2018-02-21 DIAGNOSIS — E782 Mixed hyperlipidemia: Secondary | ICD-10-CM

## 2018-02-21 DIAGNOSIS — R55 Syncope and collapse: Secondary | ICD-10-CM | POA: Diagnosis not present

## 2018-02-21 DIAGNOSIS — I10 Essential (primary) hypertension: Secondary | ICD-10-CM | POA: Diagnosis not present

## 2018-02-21 LAB — BASIC METABOLIC PANEL WITH GFR
BUN/Creatinine Ratio: 25 (ref 12–28)
BUN: 17 mg/dL (ref 8–27)
CO2: 28 mmol/L (ref 20–29)
Calcium: 10 mg/dL (ref 8.7–10.3)
Chloride: 91 mmol/L — ABNORMAL LOW (ref 96–106)
Creatinine, Ser: 0.67 mg/dL (ref 0.57–1.00)
GFR calc Af Amer: 93 mL/min/{1.73_m2}
GFR calc non Af Amer: 80 mL/min/{1.73_m2}
Glucose: 81 mg/dL (ref 65–99)
Potassium: 4.3 mmol/L (ref 3.5–5.2)
Sodium: 133 mmol/L — ABNORMAL LOW (ref 134–144)

## 2018-02-21 NOTE — Progress Notes (Signed)
Cardiology Office Note:    Date:  02/21/2018   ID:  Katelyn Cole, DOB 1930-05-14, MRN [id number]  PCP:  Maris Berger, MD  Cardiologist:  Jenne Campus, MD    Referring MD: Maris Berger, MD   Chief Complaint  Patient presents with  . Follow-up  Doing well  History of Present Illness:    Katelyn Cole is a 84 y.o. female with ascending aortic aneurysm without rupture, essential hypertension, dyslipidemia, palpitation, dizziness comes today to office for follow-up overall doing well denies have any chest pain tightness squeezing pressure burning chest no palpitations no dizziness.  She said only when she gets up very quickly she will get slightly lightheaded but not to the point of falling down or passing out.  Past Medical History:  Diagnosis Date  . Aneurysm of thoracic aorta ([place]) 01/03/2013  . Anxiety 05/21/2015  . CAD (coronary artery disease) 05/21/2015  . Depression, major, in remission ([place]) 05/21/2015  . Dyspnea on exertion 06/23/2016  . Essential hypertension 06/23/2016  . Hyperlipidemia 05/21/2015  . Hypertensive heart disease 05/21/2015  . Palpitations 06/23/2016  . Syncope and collapse 06/23/2016      Current Medications: Current Meds  Medication Sig  . aspirin EC 81 MG tablet Take 1 tablet by mouth daily.  . calcium citrate-vitamin D (CITRACAL+D) 315-200 MG-UNIT tablet Take 1 tablet by mouth 2 (two) times daily.  . carvedilol (COREG) 6.25 MG tablet Take 1 tablet by mouth 2 (two) times daily.  . chlorthalidone (HYGROTON) 25 MG tablet Take 12.5 mg by mouth daily.  . Cyanocobalamin (VITAMIN B-12) 500 MCG LOZG Take 1 lozenge by mouth daily.  Marland Kitchen losartan (COZAAR) 100 MG tablet Take 1 tablet by mouth daily.  . Multiple Vitamin (MULTI-VITAMINS) TABS Take 1 tablet by mouth daily.  . Omega-3 1000 MG CAPS Take 1 g by mouth daily.  . pravastatin (PRAVACHOL) 40 MG tablet Take 1 tablet by mouth at bedtime.  . sertraline (ZOLOFT) 25 MG tablet Take 1 tablet by mouth daily.  Marland Kitchen  triamcinolone (NASACORT) 55 MCG/ACT AERO nasal inhaler Place 2 sprays into the nose daily.     Allergies:   Atorvastatin; Lisinopril; and Rosuvastatin   Social History   Socioeconomic History  . Marital status: Widowed    Spouse name: Not on file  . Number of children: Not on file  . Years of education: Not on file  . Highest education level: Not on file  Occupational History  . Not on file  Social Needs  . Financial resource strain: Not on file  . Food insecurity:    Worry: Not on file    Inability: Not on file  . Transportation needs:    Medical: Not on file    Non-medical: Not on file  Tobacco Use  . Smoking status: Never Smoker  . Smokeless tobacco: Never Used  Substance and Sexual Activity  . Alcohol use: Not Currently  . Drug use: Never  . Sexual activity: Not on file  Lifestyle  . Physical activity:    Days per week: Not on file    Minutes per session: Not on file  . Stress: Not on file  Relationships  . Social connections:    Talks on phone: Not on file    Gets together: Not on file    Attends religious service: Not on file    Active member of club or organization: Not on file    Attends meetings of clubs or organizations: Not on file  Relationship status: Not on file  Other Topics Concern  . Not on file  Social History Narrative  . Not on file     Family History: The patient's family history includes Aneurysm in her brother and mother; Heart disease in her mother. ROS:   Please see the history of present illness.    All 14 point review of systems negative except as described per history of present illness  EKGs/Labs/Other Studies Reviewed:    EKG today showed normal sinus rhythm normal P interval normal QS complex duration morphology no ST-T segment changes  Recent Labs: No results found for requested labs within last 8760 hours.  Recent Lipid Panel No results found for: CHOL, TRIG, HDL, CHOLHDL, VLDL, LDLCALC, LDLDIRECT  Physical Exam:     VS:  BP 130/76   Pulse 76   Ht 5\' 2"  (1.575 m)   Wt 131 lb 3.2 oz (59.5 kg)   SpO2 98%   BMI 24.00 kg/m     Wt Readings from Last 3 Encounters:  02/21/18 131 lb 3.2 oz (59.5 kg)     GEN:  Well nourished, well developed in no acute distress HEENT: Normal NECK: No JVD; No carotid bruits LYMPHATICS: No lymphadenopathy CARDIAC: RRR, no murmurs, no rubs, no gallops RESPIRATORY:  Clear to auscultation without rales, wheezing or rhonchi  ABDOMEN: Soft, non-tender, non-distended MUSCULOSKELETAL:  No edema; No deformity  SKIN: Warm and dry LOWER EXTREMITIES: no swelling NEUROLOGIC:  Alert and oriented x 3 PSYCHIATRIC:  Normal affect   ASSESSMENT:    1. Thoracic aortic aneurysm without rupture ([place])   2. Essential hypertension   3. Syncope and collapse   4. Mixed hyperlipidemia   5. Palpitations    PLAN:    In order of problems listed above:  1. Thoracic aortic aneurysm without rupture.  Overall doing very well from that point review asymptomatic time to repeat CT of her chest with contrast will check Chem-7 if Chem-7 is fine I will do CT of her chest. 2. Essential hypertension blood pressure appears to be well controlled right now we will continue present management. 3. Syncope and collapse denies having any lately 4. Dyslipidemia followed by primary care physician apparently stable will call the office to get report of her fasting lipid profile 5. Palpitations denies having any.   Medication Adjustments/Labs and Tests Ordered: Current medicines are reviewed at length with the patient today.  Concerns regarding medicines are outlined above.  No orders of the defined types were placed in this encounter.  Medication changes: No orders of the defined types were placed in this encounter.   Signed, Park Liter, MD, [facility] 02/21/2018 8:47 AM    [place]

## 2018-02-21 NOTE — Patient Instructions (Signed)
Medication Instructions:  Your physician recommends that you continue on your current medications as directed. Please refer to the Current Medication list given to you today.  If you need a refill on your cardiac medications before your next appointment, please call your pharmacy.   Lab work: Your physician recommends that you return for lab work today: BMP   If you have labs (blood work) drawn today and your tests are completely normal, you will receive your results only by: Marland Kitchen MyChart Message (if you have MyChart) OR . A paper copy in the mail If you have any lab test that is abnormal or we need to change your treatment, we will call you to review the results.  Testing/Procedures: Non-Cardiac CT scanning, (CAT scanning), is a noninvasive, special x-ray that produces cross-sectional images of the body using x-rays and a computer. CT scans help physicians diagnose and treat medical conditions. For some CT exams, a contrast material is used to enhance visibility in the area of the body being studied. CT scans provide greater clarity and reveal more details than regular x-ray exams.   Follow-Up: At [facility], you and your health needs are our priority.  As part of our continuing mission to provide you with exceptional heart care, we have created designated Provider Care Teams.  These Care Teams include your primary Cardiologist (physician) and Advanced Practice Providers (APPs -  Physician Assistants and Nurse Practitioners) who all work together to provide you with the care you need, when you need it. You will need a follow up appointment in 6 months.  Please call our office 2 months in advance to schedule this appointment.  You may see No primary care provider on file. or another member of our [employer] Provider Team in [place]: Shirlee More, MD . Jyl Heinz, MD  Any Other Special Instructions Will Be Listed Below (If Applicable).

## 2018-02-21 NOTE — Addendum Note (Signed)
Addended by: Ashok Norris on: 02/21/2018 09:04 AM   Modules accepted: Orders

## 2018-02-22 DIAGNOSIS — L820 Inflamed seborrheic keratosis: Secondary | ICD-10-CM | POA: Diagnosis not present

## 2018-02-22 DIAGNOSIS — D2239 Melanocytic nevi of other parts of face: Secondary | ICD-10-CM | POA: Diagnosis not present

## 2018-02-22 DIAGNOSIS — L821 Other seborrheic keratosis: Secondary | ICD-10-CM | POA: Diagnosis not present

## 2018-02-22 DIAGNOSIS — L578 Other skin changes due to chronic exposure to nonionizing radiation: Secondary | ICD-10-CM | POA: Diagnosis not present

## 2018-02-22 DIAGNOSIS — D225 Melanocytic nevi of trunk: Secondary | ICD-10-CM | POA: Diagnosis not present

## 2018-02-22 DIAGNOSIS — C44519 Basal cell carcinoma of skin of other part of trunk: Secondary | ICD-10-CM | POA: Diagnosis not present

## 2018-02-22 DIAGNOSIS — C44622 Squamous cell carcinoma of skin of right upper limb, including shoulder: Secondary | ICD-10-CM | POA: Diagnosis not present

## 2018-03-13 DIAGNOSIS — M25562 Pain in left knee: Secondary | ICD-10-CM | POA: Diagnosis not present

## 2018-03-13 DIAGNOSIS — M1712 Unilateral primary osteoarthritis, left knee: Secondary | ICD-10-CM | POA: Diagnosis not present

## 2018-03-13 DIAGNOSIS — M25462 Effusion, left knee: Secondary | ICD-10-CM | POA: Diagnosis not present

## 2018-03-13 DIAGNOSIS — G8929 Other chronic pain: Secondary | ICD-10-CM | POA: Insufficient documentation

## 2018-03-14 ENCOUNTER — Ambulatory Visit (INDEPENDENT_AMBULATORY_CARE_PROVIDER_SITE_OTHER)
Admission: RE | Admit: 2018-03-14 | Discharge: 2018-03-14 | Disposition: A | Discharge status: Home / Self Care | Payer: PPO | Source: Ambulatory Visit | Attending: Cardiology | Admitting: Cardiology

## 2018-03-14 DIAGNOSIS — I712 Thoracic aortic aneurysm, without rupture, unspecified: Secondary | ICD-10-CM

## 2018-03-14 MED ORDER — IOPAMIDOL (ISOVUE-370) INJECTION 76%
100.0000 mL | Freq: Once | INTRAVENOUS | Status: AC | PRN
  Administered 2018-03-14: 100 mL via INTRAVENOUS

## 2018-03-15 ENCOUNTER — Telehealth: Payer: Self-pay | Admitting: Emergency Medicine

## 2018-03-15 NOTE — Telephone Encounter (Signed)
Patient informed of results.  

## 2018-03-15 NOTE — Telephone Encounter (Signed)
Left message for patient to return call regarding ct results.  

## 2018-05-07 ENCOUNTER — Telehealth (HOSPITAL_COMMUNITY): Payer: Self-pay | Admitting: *Deleted

## 2018-05-07 ENCOUNTER — Encounter: Payer: Self-pay | Admitting: Cardiology

## 2018-05-07 ENCOUNTER — Ambulatory Visit (INDEPENDENT_AMBULATORY_CARE_PROVIDER_SITE_OTHER): Payer: PPO | Admitting: Cardiology

## 2018-05-07 VITALS — BP 106/70 | HR 71 | Ht 62.0 in | Wt 130.6 lb

## 2018-05-07 DIAGNOSIS — F419 Anxiety disorder, unspecified: Secondary | ICD-10-CM | POA: Diagnosis not present

## 2018-05-07 DIAGNOSIS — I712 Thoracic aortic aneurysm, without rupture, unspecified: Secondary | ICD-10-CM

## 2018-05-07 DIAGNOSIS — E782 Mixed hyperlipidemia: Secondary | ICD-10-CM

## 2018-05-07 DIAGNOSIS — I10 Essential (primary) hypertension: Secondary | ICD-10-CM

## 2018-05-07 NOTE — Progress Notes (Signed)
Cardiology Office Note:    Date:  05/07/2018   ID:  Katelyn Cole, DOB 1930-09-29, MRN [id number]  PCP:  Maris Berger, MD  Cardiologist:  Jenne Campus, MD    Referring MD: Maris Berger, MD   Chief Complaint  Patient presents with  . Pre-op Exam  I need knee surgery  History of Present Illness:    Katelyn Cole is a 85 y.o. female with ascending octagon was which is only mild, essential hypertension, dyslipidemia she was referred to me for evaluation before left knee replacement surgery.  Overall she seems to be doing well cardiac wise however her ability to exercise obviously is limited because of knee pain.  She said she have a garden and in the summer she was working the garden but have a lot of issues because of new problem denies having any chest pain tightness squeezing pressure burning chest but her exercise capacity is limited because of arthritis.  We talked in length about what to do with the situation and I think the best approach will be to proceed with echocardiogram to assess her left ventricular ejection fraction.  There is also value to stress testing she does have some risk factors for coronary artery disease.  I will schedule her to have [place].  Past Medical History:  Diagnosis Date  . Aneurysm of thoracic aorta ([place]) 01/03/2013  . Anxiety 05/21/2015  . CAD (coronary artery disease) 05/21/2015  . Depression, major, in remission ([place]) 05/21/2015  . Dyspnea on exertion 06/23/2016  . Essential hypertension 06/23/2016  . Hyperlipidemia 05/21/2015  . Hypertensive heart disease 05/21/2015  . Palpitations 06/23/2016  . Syncope and collapse 06/23/2016      Current Medications: Current Meds  Medication Sig  . aspirin EC 81 MG tablet Take 1 tablet by mouth daily.  . calcium citrate-vitamin D (CITRACAL+D) 315-200 MG-UNIT tablet Take 1 tablet by mouth 2 (two) times daily.  . carvedilol (COREG) 6.25 MG tablet Take 1 tablet by mouth 2 (two) times daily.  . chlorthalidone  (HYGROTON) 25 MG tablet Take 12.5 mg by mouth daily.  . Cyanocobalamin (VITAMIN B-12) 500 MCG LOZG Take 1 lozenge by mouth daily.  Marland Kitchen losartan (COZAAR) 100 MG tablet Take 1 tablet by mouth daily.  . Multiple Vitamin (MULTI-VITAMINS) TABS Take 1 tablet by mouth daily.  . Omega-3 1000 MG CAPS Take 1 g by mouth daily.  . pravastatin (PRAVACHOL) 40 MG tablet Take 1 tablet by mouth at bedtime.  . sertraline (ZOLOFT) 25 MG tablet Take 1 tablet by mouth daily.  Marland Kitchen triamcinolone (NASACORT) 55 MCG/ACT AERO nasal inhaler Place 2 sprays into the nose daily.     Allergies:   Atorvastatin; Lisinopril; and Rosuvastatin   Social History   Socioeconomic History  . Marital status: Widowed    Spouse name: Not on file  . Number of children: Not on file  . Years of education: Not on file  . Highest education level: Not on file  Occupational History  . Not on file  Social Needs  . Financial resource strain: Not on file  . Food insecurity:    Worry: Not on file    Inability: Not on file  . Transportation needs:    Medical: Not on file    Non-medical: Not on file  Tobacco Use  . Smoking status: Never Smoker  . Smokeless tobacco: Never Used  Substance and Sexual Activity  . Alcohol use: Not Currently  . Drug use: Never  . Sexual activity: Not on  file  Lifestyle  . Physical activity:    Days per week: Not on file    Minutes per session: Not on file  . Stress: Not on file  Relationships  . Social connections:    Talks on phone: Not on file    Gets together: Not on file    Attends religious service: Not on file    Active member of club or organization: Not on file    Attends meetings of clubs or organizations: Not on file    Relationship status: Not on file  Other Topics Concern  . Not on file  Social History Narrative  . Not on file     Family History: The patient's family history includes Aneurysm in her brother and mother; Heart disease in her mother. ROS:   Please see the history of  present illness.    All 14 point review of systems negative except as described per history of present illness  EKGs/Labs/Other Studies Reviewed:    Normal sinus rhythm normal P interval normal QrS complex duration morphology  Recent Labs: 02/21/2018: BUN 17; Creatinine, Ser 0.67; Potassium 4.3; Sodium 133  Recent Lipid Panel No results found for: CHOL, TRIG, HDL, CHOLHDL, VLDL, LDLCALC, LDLDIRECT  Physical Exam:    VS:  BP 106/70   Pulse 71   Ht 5\' 2"  (1.575 m)   Wt 130 lb 9.6 oz (59.2 kg)   SpO2 97%   BMI 23.89 kg/m     Wt Readings from Last 3 Encounters:  05/07/18 130 lb 9.6 oz (59.2 kg)  02/21/18 131 lb 3.2 oz (59.5 kg)     GEN:  Well nourished, well developed in no acute distress HEENT: Normal NECK: No JVD; No carotid bruits LYMPHATICS: No lymphadenopathy CARDIAC: RRR, no murmurs, no rubs, no gallops RESPIRATORY:  Clear to auscultation without rales, wheezing or rhonchi  ABDOMEN: Soft, non-tender, non-distended MUSCULOSKELETAL:  No edema; No deformity  SKIN: Warm and dry LOWER EXTREMITIES: no swelling NEUROLOGIC:  Alert and oriented x 3 PSYCHIATRIC:  Normal affect   ASSESSMENT:    1. Essential hypertension   2. Thoracic aortic aneurysm without rupture ([place])   3. Mixed hyperlipidemia   4. Anxiety    PLAN:    In order of problems listed above:  1. Essential hypertension.  Blood pressure well controlled today we will continue present management. 2. Preop evaluation for left knee replacement surgery which will be done in general anesthesia.  I will schedule her to have echocardiogram as well as stress test. 3. Dyslipidemia followed by internal medicine team.  She is on appropriate statin which I will continue. 4. Anxiety: Seems to be doing well.   Medication Adjustments/Labs and Tests Ordered: Current medicines are reviewed at length with the patient today.  Concerns regarding medicines are outlined above.  No orders of the defined types were placed in this  encounter.  Medication changes: No orders of the defined types were placed in this encounter.   Signed, Park Liter, MD, [facility] 05/07/2018 11:41 AM    [place]

## 2018-05-07 NOTE — Telephone Encounter (Signed)
Patient given detailed instructions per Myocardial Perfusion Study Information Sheet for the test on 05/09/18. Patient notified to arrive 15 minutes early and that it is imperative to arrive on time for appointment to keep from having the test rescheduled.  If you need to cancel or reschedule your appointment, please call the office within 24 hours of your appointment. . Patient verbalized understanding. Kirstie Peri

## 2018-05-07 NOTE — Patient Instructions (Signed)
Medication Instructions:  Your physician recommends that you continue on your current medications as directed. Please refer to the Current Medication list given to you today.  If you need a refill on your cardiac medications before your next appointment, please call your pharmacy.   Lab work: None.  If you have labs (blood work) drawn today and your tests are completely normal, you will receive your results only by: Marland Kitchen MyChart Message (if you have MyChart) OR . A paper copy in the mail If you have any lab test that is abnormal or we need to change your treatment, we will call you to review the results.  Testing/Procedures: Your physician has requested that you have an echocardiogram. Echocardiography is a painless test that uses sound waves to create images of your heart. It provides your doctor with information about the size and shape of your heart and how well your heart's chambers and valves are working. This procedure takes approximately one hour. There are no restrictions for this procedure.  Your physician has requested that you have a lexiscan myoview. For further information please visit [email or web address]. Please follow instruction sheet, as given.    Follow-Up: At [facility], you and your health needs are our priority.  As part of our continuing mission to provide you with exceptional heart care, we have created designated Provider Care Teams.  These Care Teams include your primary Cardiologist (physician) and Advanced Practice Providers (APPs -  Physician Assistants and Nurse Practitioners) who all work together to provide you with the care you need, when you need it. You will need a follow up appointment in 6 months.  Please call our office 2 months in advance to schedule this appointment.  You may see No primary care provider on file. or another member of our [employer] Provider Team in [place]: Shirlee More, MD . Jyl Heinz, MD  Any Other Special Instructions  Will Be Listed Below (If Applicable).   Echocardiogram An echocardiogram is a procedure that uses painless sound waves (ultrasound) to produce an image of the heart. Images from an echocardiogram can provide important information about:  Signs of coronary artery disease (CAD).  Aneurysm detection. An aneurysm is a weak or damaged part of an artery wall that bulges out from the normal force of blood pumping through the body.  Heart size and shape. Changes in the size or shape of the heart can be associated with certain conditions, including heart failure, aneurysm, and CAD.  Heart muscle function.  Heart valve function.  Signs of a past heart attack.  Fluid buildup around the heart.  Thickening of the heart muscle.  A tumor or infectious growth around the heart valves. Tell a health care provider about:  Any allergies you have.  All medicines you are taking, including vitamins, herbs, eye drops, creams, and over-the-counter medicines.  Any blood disorders you have.  Any surgeries you have had.  Any medical conditions you have.  Whether you are pregnant or may be pregnant. What are the risks? Generally, this is a safe procedure. However, problems may occur, including:  Allergic reaction to dye (contrast) that may be used during the procedure. What happens before the procedure? No specific preparation is needed. You may eat and drink normally. What happens during the procedure?   An IV tube may be inserted into one of your veins.  You may receive contrast through this tube. A contrast is an injection that improves the quality of the pictures from your heart.  A  gel will be applied to your chest.  A wand-like tool (transducer) will be moved over your chest. The gel will help to transmit the sound waves from the transducer.  The sound waves will harmlessly bounce off of your heart to allow the heart images to be captured in real-time motion. The images will be recorded  on a computer. The procedure may vary among health care providers and hospitals. What happens after the procedure?  You may return to your normal, everyday life, including diet, activities, and medicines, unless your health care provider tells you not to do that. Summary  An echocardiogram is a procedure that uses painless sound waves (ultrasound) to produce an image of the heart.  Images from an echocardiogram can provide important information about the size and shape of your heart, heart muscle function, heart valve function, and fluid buildup around your heart.  You do not need to do anything to prepare before this procedure. You may eat and drink normally.  After the echocardiogram is completed, you may return to your normal, everyday life, unless your health care provider tells you not to do that. This information is not intended to replace advice given to you by your health care provider. Make sure you discuss any questions you have with your health care provider. Document Released: 03/18/2000 Document Revised: 04/23/2016 Document Reviewed: 04/23/2016 Elsevier Interactive Patient Education  2019 [place].   Cardiac Nuclear Scan A cardiac nuclear scan is a test that measures blood flow to the heart when a person is resting and when he or she is exercising. The test looks for problems such as:  Not enough blood reaching a portion of the heart.  The heart muscle not working normally. You may need this test if:  You have heart disease.  You have had abnormal lab results.  You have had heart surgery or a balloon procedure to open up blocked arteries (angioplasty).  You have chest pain.  You have shortness of breath. In this test, a radioactive dye (tracer) is injected into your bloodstream. After the tracer has traveled to your heart, an imaging device is used to measure how much of the tracer is absorbed by or distributed to various areas of your heart. This procedure is  usually done at a hospital and takes 2-4 hours. Tell a health care provider about:  Any allergies you have.  All medicines you are taking, including vitamins, herbs, eye drops, creams, and over-the-counter medicines.  Any problems you or family members have had with anesthetic medicines.  Any blood disorders you have.  Any surgeries you have had.  Any medical conditions you have.  Whether you are pregnant or may be pregnant. What are the risks? Generally, this is a safe procedure. However, problems may occur, including:  Serious chest pain and heart attack. This is only a risk if the stress portion of the test is done.  Rapid heartbeat.  Sensation of warmth in your chest. This usually passes quickly.  Allergic reaction to the tracer. What happens before the procedure?  Ask your health care provider about changing or stopping your regular medicines. This is especially important if you are taking diabetes medicines or blood thinners.  Follow instructions from your health care provider about eating or drinking restrictions.  Remove your jewelry on the day of the procedure. What happens during the procedure?  An IV will be inserted into one of your veins.  Your health care provider will inject a small amount of radioactive tracer  through the IV.  You will wait for 20-40 minutes while the tracer travels through your bloodstream.  Your heart activity will be monitored with an electrocardiogram (ECG).  You will lie down on an exam table.  Images of your heart will be taken for about 15-20 minutes.  You may also have a stress test. For this test, one of the following may be done: ? You will exercise on a treadmill or stationary bike. While you exercise, your heart's activity will be monitored with an ECG, and your blood pressure will be checked. ? You will be given medicines that will increase blood flow to parts of your heart. This is done if you are unable to  exercise.  When blood flow to your heart has peaked, a tracer will again be injected through the IV.  After 20-40 minutes, you will get back on the exam table and have more images taken of your heart.  Depending on the type of tracer used, scans may need to be repeated 3-4 hours later.  Your IV line will be removed when the procedure is over. The procedure may vary among health care providers and hospitals. What happens after the procedure?  Unless your health care provider tells you otherwise, you may return to your normal schedule, including diet, activities, and medicines.  Unless your health care provider tells you otherwise, you may increase your fluid intake. This will help to flush the contrast dye from your body. Drink enough fluid to keep your urine pale yellow.  Ask your health care provider, or the department that is doing the test: ? When will my results be ready? ? How will I get my results? Summary  A cardiac nuclear scan measures the blood flow to the heart when a person is resting and when he or she is exercising.  Tell your health care provider if you are pregnant.  Before the procedure, ask your health care provider about changing or stopping your regular medicines. This is especially important if you are taking diabetes medicines or blood thinners.  After the procedure, unless your health care provider tells you otherwise, increase your fluid intake. This will help flush the contrast dye from your body.  After the procedure, unless your health care provider tells you otherwise, you may return to your normal schedule, including diet, activities, and medicines. This information is not intended to replace advice given to you by your health care provider. Make sure you discuss any questions you have with your health care provider. Document Released: 04/15/2004 Document Revised: 09/04/2017 Document Reviewed: 09/04/2017 Elsevier Interactive Patient Education  2019  [employer].

## 2018-05-09 ENCOUNTER — Ambulatory Visit (INDEPENDENT_AMBULATORY_CARE_PROVIDER_SITE_OTHER): Payer: PPO

## 2018-05-09 DIAGNOSIS — R002 Palpitations: Secondary | ICD-10-CM | POA: Diagnosis not present

## 2018-05-09 DIAGNOSIS — I712 Thoracic aortic aneurysm, without rupture: Secondary | ICD-10-CM | POA: Diagnosis not present

## 2018-05-09 DIAGNOSIS — I2510 Atherosclerotic heart disease of native coronary artery without angina pectoris: Secondary | ICD-10-CM | POA: Diagnosis not present

## 2018-05-09 DIAGNOSIS — R55 Syncope and collapse: Secondary | ICD-10-CM | POA: Diagnosis not present

## 2018-05-09 DIAGNOSIS — I10 Essential (primary) hypertension: Secondary | ICD-10-CM | POA: Diagnosis not present

## 2018-05-09 LAB — MYOCARDIAL PERFUSION IMAGING
LVDIAVOL: 59 mL (ref 46–106)
LVSYSVOL: 16 mL
NUC STRESS TID: 0.88
Peak HR: 85 {beats}/min
Rest HR: 62 {beats}/min
SDS: 0
SRS: 2
SSS: 2

## 2018-05-09 MED ORDER — TECHNETIUM TC 99M TETROFOSMIN IV KIT
9.8000 | PACK | Freq: Once | INTRAVENOUS | Status: AC | PRN
  Administered 2018-05-09: 9.8 via INTRAVENOUS

## 2018-05-09 MED ORDER — TECHNETIUM TC 99M TETROFOSMIN IV KIT
32.2000 | PACK | Freq: Once | INTRAVENOUS | Status: AC | PRN
  Administered 2018-05-09: 32.2 via INTRAVENOUS

## 2018-05-09 MED ORDER — REGADENOSON 0.4 MG/5ML IV SOLN
0.4000 mg | Freq: Once | INTRAVENOUS | Status: AC
  Administered 2018-05-09: 0.4 mg via INTRAVENOUS

## 2018-05-18 ENCOUNTER — Ambulatory Visit (INDEPENDENT_AMBULATORY_CARE_PROVIDER_SITE_OTHER): Payer: PPO

## 2018-05-18 DIAGNOSIS — I10 Essential (primary) hypertension: Secondary | ICD-10-CM | POA: Diagnosis not present

## 2018-05-18 DIAGNOSIS — I712 Thoracic aortic aneurysm, without rupture: Secondary | ICD-10-CM

## 2018-05-18 NOTE — Progress Notes (Signed)
Complete echocardiogram has been performed.  Jimmy Daxten Kovalenko RDCS, RVT 

## 2018-05-21 ENCOUNTER — Other Ambulatory Visit: Payer: PPO

## 2018-06-04 ENCOUNTER — Other Ambulatory Visit: Payer: Self-pay | Admitting: Orthopedic Surgery

## 2018-08-20 ENCOUNTER — Ambulatory Visit: Admit: 2018-08-20 | Payer: PPO | Admitting: Orthopedic Surgery

## 2018-08-21 ENCOUNTER — Other Ambulatory Visit: Payer: Self-pay | Admitting: Orthopedic Surgery

## 2018-08-28 DIAGNOSIS — D2239 Melanocytic nevi of other parts of face: Secondary | ICD-10-CM | POA: Diagnosis not present

## 2018-08-28 DIAGNOSIS — D485 Neoplasm of uncertain behavior of skin: Secondary | ICD-10-CM | POA: Diagnosis not present

## 2018-08-28 DIAGNOSIS — D225 Melanocytic nevi of trunk: Secondary | ICD-10-CM | POA: Diagnosis not present

## 2018-08-28 DIAGNOSIS — L814 Other melanin hyperpigmentation: Secondary | ICD-10-CM | POA: Diagnosis not present

## 2018-08-28 DIAGNOSIS — L821 Other seborrheic keratosis: Secondary | ICD-10-CM | POA: Diagnosis not present

## 2018-08-28 DIAGNOSIS — C44319 Basal cell carcinoma of skin of other parts of face: Secondary | ICD-10-CM | POA: Diagnosis not present

## 2018-08-30 DIAGNOSIS — C44719 Basal cell carcinoma of skin of left lower limb, including hip: Secondary | ICD-10-CM | POA: Diagnosis not present

## 2018-09-03 SURGERY — ARTHROPLASTY, KNEE, UNICOMPARTMENTAL
Anesthesia: Spinal | Laterality: Left

## 2018-09-10 NOTE — Patient Instructions (Addendum)
Katelyn Cole    Your procedure is scheduled on: 09-17-2018   Report to [facility] Main  Entrance    Report to admitting at 855 AM   [place] [facility] TEST ON 09-13-18  @ 3:00 PM, THIS TEST MUST BE DONE BEFORE SURGERY, COME TO [place]. ONCE YOUR TEST IS COMPLETED, PLEASE BEGIN THE QUARANTINE INSTRUCTIONS AS OUTLINED IN YOUR HANDOUT   Call this number if you have problems the morning of surgery [phone]    Remember: BRUSH YOUR TEETH MORNING OF SURGERY AND RINSE YOUR MOUTH OUT, NO CHEWING GUM CANDY OR MINTS.   NO SOLID FOOD AFTER MIDNIGHT THE NIGHT PRIOR TO SURGERY. NOTHING BY MOUTH EXCEPT CLEAR LIQUIDS UNTIL  430 AM. PLEASE FINISH ENSURE DRINK PER SURGEON ORDER  TIME WHICH NEEDS TO BE COMPLETED AT 430 AM.     CLEAR LIQUID DIET   Foods Allowed                                                                     Foods Excluded  Coffee and tea, regular and decaf                             liquids that you cannot  Plain Jell-O in any flavor                                             see through such as: Fruit ices (not with fruit pulp)                                     milk, soups, orange juice  Iced Popsicles                                    All solid food Carbonated beverages, regular and diet                                    Cranberry, grape and apple juices Sports drinks like Gatorade Lightly seasoned clear broth or consume(fat free) Sugar, honey syrup  Sample Menu Breakfast                                Lunch                                     Supper Cranberry juice                    Beef broth  Chicken broth Jell-O                                     Grape juice                           Apple juice Coffee or tea                        Jell-O                                      Popsicle                                                Coffee or tea                         Coffee or tea  _____________________________________________________________________    Take these medicines the morning of surgery with A SIP OF WATER: SERTRALIN (ZOLOFT), CARVEDILOL (COREG)                               You may not have any metal on your body including hair pins and              piercings    Do not wear jewelry, make-up, lotions, powders or perfumes, deodorant             Do not wear nail polish.  Do not shave  48 hours prior to surgery.             Do not bring valuables to the hospital. [place].  Contacts, dentures or bridgework may not be worn into surgery.             _____________________________________________________________________             [facility] - Preparing for Surgery Before surgery, you can play an important role.  Because skin is not sterile, your skin needs to be as free of germs as possible.  You can reduce the number of germs on your skin by washing with CHG (chlorahexidine gluconate) soap before surgery.  CHG is an antiseptic cleaner which kills germs and bonds with the skin to continue killing germs even after washing. Please DO NOT use if you have an allergy to CHG or antibacterial soaps.  If your skin becomes reddened/irritated stop using the CHG and inform your nurse when you arrive at Short Stay. Do not shave (including legs and underarms) for at least 48 hours prior to the first CHG shower.  You may shave your face/neck. Please follow these instructions carefully:  1.  Shower with CHG Soap the night before surgery and the  morning of Surgery.  2.  If you choose to wash your hair, wash your hair first as usual with your  normal  shampoo.  3.  After you shampoo, rinse your hair and body thoroughly to remove the  shampoo.  4.  Use CHG as you would any other liquid soap.  You can apply chg directly  to the skin and wash                       Gently with a  scrungie or clean washcloth.  5.  Apply the CHG Soap to your body ONLY FROM THE NECK DOWN.   Do not use on face/ open                           Wound or open sores. Avoid contact with eyes, ears mouth and genitals (private parts).                       Wash face,  Genitals (private parts) with your normal soap.             6.  Wash thoroughly, paying special attention to the area where your surgery  will be performed.  7.  Thoroughly rinse your body with warm water from the neck down.  8.  DO NOT shower/wash with your normal soap after using and rinsing off  the CHG Soap.                9.  Pat yourself dry with a clean towel.            10.  Wear clean pajamas.            11.  Place clean sheets on your bed the night of your first shower and do not  sleep with pets. Day of Surgery : Do not apply any lotions/deodorants the morning of surgery.  Please wear clean clothes to the hospital/surgery center.  FAILURE TO FOLLOW THESE INSTRUCTIONS MAY RESULT IN THE CANCELLATION OF YOUR SURGERY PATIENT SIGNATURE_________________________________  NURSE SIGNATURE__________________________________  ________________________________________________________________________   [name]  An incentive spirometer is a tool that can help keep your lungs clear and active. This tool measures how well you are filling your lungs with each breath. Taking long deep breaths may help reverse or decrease the chance of developing breathing (pulmonary) problems (especially infection) following:  A long period of time when you are unable to move or be active. BEFORE THE PROCEDURE   If the spirometer includes an indicator to show your best effort, your nurse or respiratory therapist will set it to a desired goal.  If possible, sit up straight or lean slightly forward. Try not to slouch.  Hold the incentive spirometer in an upright position. INSTRUCTIONS FOR USE  1. Sit on the edge of your bed if possible,  or sit up as far as you can in bed or on a chair. 2. Hold the incentive spirometer in an upright position. 3. Breathe out normally. 4. Place the mouthpiece in your mouth and seal your lips tightly around it. 5. Breathe in slowly and as deeply as possible, raising the piston or the ball toward the top of the column. 6. Hold your breath for 3-5 seconds or for as long as possible. Allow the piston or ball to fall to the bottom of the column. 7. Remove the mouthpiece from your mouth and breathe out normally. 8. Rest for a few seconds and repeat Steps 1 through 7 at least 10 times every 1-2 hours when you are awake. Take your time and take a few normal breaths between deep breaths. 9. The spirometer may include an indicator to  show your best effort. Use the indicator as a goal to work toward during each repetition. 10. After each set of 10 deep breaths, practice coughing to be sure your lungs are clear. If you have an incision (the cut made at the time of surgery), support your incision when coughing by placing a pillow or rolled up towels firmly against it. Once you are able to get out of bed, walk around indoors and cough well. You may stop using the incentive spirometer when instructed by your caregiver.  RISKS AND COMPLICATIONS  Take your time so you do not get dizzy or light-headed.  If you are in pain, you may need to take or ask for pain medication before doing incentive spirometry. It is harder to take a deep breath if you are having pain. AFTER USE  Rest and breathe slowly and easily.  It can be helpful to keep track of a log of your progress. Your caregiver can provide you with a simple table to help with this. If you are using the spirometer at home, follow these instructions: [place] IF:   You are having difficultly using the spirometer.  You have trouble using the spirometer as often as instructed.  Your pain medication is not giving enough relief while using the  spirometer.  You develop fever of 100.5 F (38.1 C) or higher. SEEK IMMEDIATE MEDICAL CARE IF:   You cough up bloody sputum that had not been present before.  You develop fever of 102 F (38.9 C) or greater.  You develop worsening pain at or near the incision site. MAKE SURE YOU:   Understand these instructions.  Will watch your condition.  Will get help right away if you are not doing well or get worse. Document Released: 08/01/2006 Document Revised: 06/13/2011 Document Reviewed: 10/02/2006 [facility] Patient Information 2014 [place], [place].   ________________________________________________________________________

## 2018-09-10 NOTE — Progress Notes (Signed)
CARDIAC CLEARANCE NOTE DR Agustin Cree 05-07-18 Epic EKG 05-07-18 Epic STRESS TEST 05-09-18 Epic ECHO 05-18-18 Epic CHEST CT 03-22-18 THORACICI AORTIC ANEURYSM 40MM PER CHEST CT

## 2018-09-11 ENCOUNTER — Other Ambulatory Visit: Payer: Self-pay

## 2018-09-11 ENCOUNTER — Encounter (HOSPITAL_COMMUNITY): Payer: Self-pay

## 2018-09-11 ENCOUNTER — Encounter (HOSPITAL_COMMUNITY)
Admission: RE | Admit: 2018-09-11 | Discharge: 2018-09-11 | Disposition: A | Discharge status: Home / Self Care | Payer: PPO | Source: Ambulatory Visit | Attending: Orthopedic Surgery | Admitting: Orthopedic Surgery

## 2018-09-11 DIAGNOSIS — M1712 Unilateral primary osteoarthritis, left knee: Secondary | ICD-10-CM | POA: Insufficient documentation

## 2018-09-11 DIAGNOSIS — Z7982 Long term (current) use of aspirin: Secondary | ICD-10-CM | POA: Insufficient documentation

## 2018-09-11 DIAGNOSIS — F325 Major depressive disorder, single episode, in full remission: Secondary | ICD-10-CM | POA: Diagnosis not present

## 2018-09-11 DIAGNOSIS — F419 Anxiety disorder, unspecified: Secondary | ICD-10-CM | POA: Diagnosis not present

## 2018-09-11 DIAGNOSIS — I119 Hypertensive heart disease without heart failure: Secondary | ICD-10-CM | POA: Diagnosis not present

## 2018-09-11 DIAGNOSIS — I2510 Atherosclerotic heart disease of native coronary artery without angina pectoris: Secondary | ICD-10-CM | POA: Diagnosis not present

## 2018-09-11 DIAGNOSIS — Z79899 Other long term (current) drug therapy: Secondary | ICD-10-CM | POA: Diagnosis not present

## 2018-09-11 DIAGNOSIS — Z01818 Encounter for other preprocedural examination: Secondary | ICD-10-CM | POA: Insufficient documentation

## 2018-09-11 DIAGNOSIS — E785 Hyperlipidemia, unspecified: Secondary | ICD-10-CM | POA: Diagnosis not present

## 2018-09-11 HISTORY — DX: Other specified postprocedural states: Z98.890

## 2018-09-11 HISTORY — DX: Other complications of anesthesia, initial encounter: T88.59XA

## 2018-09-11 HISTORY — DX: Other specified postprocedural states: R11.2

## 2018-09-11 LAB — COMPREHENSIVE METABOLIC PANEL
ALT: 18 U/L (ref 0–44)
AST: 22 U/L (ref 15–41)
Albumin: 4.5 g/dL (ref 3.5–5.0)
Alkaline Phosphatase: 85 U/L (ref 38–126)
Anion gap: 9 (ref 5–15)
BUN: 19 mg/dL (ref 8–23)
CO2: 26 mmol/L (ref 22–32)
Calcium: 9.5 mg/dL (ref 8.9–10.3)
Chloride: 102 mmol/L (ref 98–111)
Creatinine, Ser: 0.88 mg/dL (ref 0.44–1.00)
GFR calc Af Amer: >60 mL/min (ref >60)
GFR calc non Af Amer: 59 mL/min — ABNORMAL LOW (ref >60)
Glucose, Bld: 102 mg/dL — ABNORMAL HIGH (ref 70–99)
Potassium: 4.3 mmol/L (ref 3.5–5.1)
Sodium: 137 mmol/L (ref 135–145)
Total Bilirubin: 0.3 mg/dL (ref 0.3–1.2)
Total Protein: 7.5 g/dL (ref 6.5–8.1)

## 2018-09-11 LAB — CBC WITH DIFFERENTIAL/PLATELET
Abs Immature Granulocytes: 0.01 10*3/uL (ref 0.00–0.07)
Basophils Absolute: 0 10*3/uL (ref 0.0–0.1)
Basophils Relative: 0 %
Eosinophils Absolute: 0.1 10*3/uL (ref 0.0–0.5)
Eosinophils Relative: 1 %
HCT: 39 % (ref 36.0–46.0)
Hemoglobin: 12.2 g/dL (ref 12.0–15.0)
Immature Granulocytes: 0 %
Lymphocytes Relative: 42 %
Lymphs Abs: 3 10*3/uL (ref 0.7–4.0)
MCH: 30.3 pg (ref 26.0–34.0)
MCHC: 31.3 g/dL (ref 30.0–36.0)
MCV: 96.8 fL (ref 80.0–100.0)
Monocytes Absolute: 0.7 10*3/uL (ref 0.1–1.0)
Monocytes Relative: 10 %
Neutro Abs: 3.3 10*3/uL (ref 1.7–7.7)
Neutrophils Relative %: 47 %
Platelets: 305 10*3/uL (ref 150–400)
RBC: 4.03 MIL/uL (ref 3.87–5.11)
RDW: 12.3 % (ref 11.5–15.5)
WBC: 7.2 10*3/uL (ref 4.0–10.5)
nRBC: 0 % (ref 0.0–0.2)

## 2018-09-11 LAB — SURGICAL PCR SCREEN
MRSA, PCR: NEGATIVE
Staphylococcus aureus: POSITIVE — AB

## 2018-09-12 NOTE — Anesthesia Preprocedure Evaluation (Addendum)
Anesthesia Evaluation  Patient identified by MRN, date of birth, ID band Patient awake    Reviewed: Allergy & Precautions, NPO status , Patient's Chart, lab work & pertinent test results  Airway Mallampati: II  TM Distance: >3 FB Neck ROM: Full    Dental  (+) Edentulous Lower, Dental Advisory Given   Pulmonary    breath sounds clear to auscultation       Cardiovascular hypertension,  Rhythm:Regular Rate:Normal     Neuro/Psych    GI/Hepatic   Endo/Other    Renal/GU      Musculoskeletal   Abdominal   Peds  Hematology   Anesthesia Other Findings   Reproductive/Obstetrics                           Anesthesia Physical Anesthesia Plan  ASA: III  Anesthesia Plan: General   Post-op Pain Management:  Regional for Post-op pain   Induction:   PONV Risk Score and Plan:   Airway Management Planned: Oral ETT  Additional Equipment:   Intra-op Plan:   Post-operative Plan: Extubation in OR  Informed Consent: I have reviewed the patients History and Physical, chart, labs and discussed the procedure including the risks, benefits and alternatives for the proposed anesthesia with the patient or authorized representative who has indicated his/her understanding and acceptance.     Dental advisory given  Plan Discussed with: CRNA and Anesthesiologist  Anesthesia Plan Comments: (See PAT note 10/25/2018, Konrad Felix, PA-C)      Anesthesia Quick Evaluation

## 2018-09-12 NOTE — Progress Notes (Signed)
Anesthesia Chart Review   Case:  [id number] Date/Time:  09/17/18 1110   Procedure:  UNICOMPARTMENTAL LEFT KNEE (Left )   Anesthesia type:  Spinal   Pre-op diagnosis:  LT. KNEE OSTEOARTHRITIS   Location:  [place] 06 / [facility]   Surgeon:  Vickey Huger, MD      DISCUSSION: 85 yo never smoker with h/o PONV, HTN, anxiety, depression, CAD, HLD, left knee OA scheduled for above procedure 09/17/18 with Dr. Vickey Huger.   Pt reports she had two skin cancers removed from left lower extremity a few weeks ago.  Wounds have been slow to heal.  She was started on an antibiotic by dermatology, completed one day ago.  She continues to had redness surrounding 1 cm circular wound to medial ankle.  Informed Dr. Ronnie Derby who will have patient come into his office for evaluation prior to upcoming surgery.    Pt seen by cardiologist, Dr. Jenne Campus, for preoperative evaluation on 05/07/2018.  Stress test and Echo ordered at this visit. No ischemia on Stress Test, normal EF on Echo.    Anticipate pt can proceed with planned procedure barring acute status change and after evaluation of left lower extremity wounds per ortho.  VS: BP (!) 150/61   Pulse 68   Temp 36.7 C (Oral)   Resp 16   Ht 5\' 2"  (1.575 m)   Wt 58.7 kg   SpO2 98%   BMI 23.65 kg/m   PROVIDERS: Maris Berger, MD is PCP   Jenne Campus, MD is Cardiologist  LABS: Labs reviewed: Acceptable for surgery. (all labs ordered are listed, but only abnormal results are displayed)  Labs Reviewed  SURGICAL PCR SCREEN - Abnormal; Notable for the following components:      Result Value   Staphylococcus aureus POSITIVE (*)    All other components within normal limits  COMPREHENSIVE METABOLIC PANEL - Abnormal; Notable for the following components:   Glucose, Bld 102 (*)    GFR calc non Af Amer 59 (*)    All other components within normal limits  CBC WITH DIFFERENTIAL/PLATELET     IMAGES: CT Angio Chest Aorta 03/14/18 IMPRESSION: 1.  Uncomplicated mild fusiform aneurysmal dilatation of the ascending thoracic aorta measuring 40 mm in diameter. Recommend annual imaging followup by CTA or MRA. This recommendation follows 2010 ACCF/AHA/AATS/ACR/ASA/SCA/SCAI/SIR/STS/SVM Guidelines for the Diagnosis and Management of Patients with Thoracic Aortic Disease. Circulation. 2010; 121: N053-Z767. Aortic aneurysm NOS (ICD10-I71.9). 2. Uncomplicated mild fusiform ectasia of the remainder of the aortic arch and descending thoracic aorta. The descending thoracic aorta tapers to a normal caliber at the level of the diaphragmatic hiatus. 3. Cardiomegaly. 4. Coronary artery calcifications. Aortic Atherosclerosis  EKG: 05/07/2018 Rate 73 bpm Normal sinus rhythm   CV: Echo 05/18/2018 IMPRESSIONS   1. The left ventricle has normal systolic function with an ejection fraction of 60-65%. The cavity size was normal. There is mildly increased left ventricular wall thickness. Left ventricular diastolic Doppler parameters are consistent with impaired  relaxation.  2. The right ventricle has normal systolic function. The cavity was normal. There is no increase in right ventricular wall thickness.  3. The mitral valve is normal in structure. Mild thickening of the mitral valve leaflet. Mild calcification of the mitral valve leaflet.  4. The tricuspid valve is normal in structure.  5. The aortic valve is tricuspid Mild thickening of the aortic valve Mild calcification of the aortic valve.  6. Aneurysm of the ascending aorta.  7. There is moderate  dilatation of the ascending aorta measuring 43 mm.  Stress Test 05/09/2018  The left ventricular ejection fraction is hyperdynamic (>65%).  Nuclear stress EF: 73%.  Blood pressure demonstrated a hypertensive response to exercise.  There was no ST segment deviation noted during stress.  Defect 1: There is a medium defect of moderate severity present in the basal anterior, mid anterior and apical  anterior location.  This is a low risk study.  Noevidence of ischemia or MI. Nomal EF. Hypertensive response. Past Medical History:  Diagnosis Date  . Aneurysm of thoracic aorta ([place]) 01/03/2013  . Anxiety 05/21/2015  . CAD (coronary artery disease) 05/21/2015  . Complication of anesthesia    Difficult to awaken  . Depression, major, in remission ([place]) 05/21/2015  . Dyspnea on exertion 06/23/2016  . Essential hypertension 06/23/2016  . Hyperlipidemia 05/21/2015  . Hypertensive heart disease 05/21/2015  . Palpitations 06/23/2016  . PONV (postoperative nausea and vomiting)   . Syncope and collapse 06/23/2016    Past Surgical History:  Procedure Laterality Date  . ABDOMINAL HYSTERECTOMY    . BLADDER SUSPENSION    . FRACTURE SURGERY Left 2007   Wrist  . NASAL SINUS SURGERY      MEDICATIONS: . aspirin EC 81 MG tablet  . calcium citrate-vitamin D (CITRACAL+D) 315-200 MG-UNIT tablet  . carvedilol (COREG) 6.25 MG tablet  . chlorthalidone (HYGROTON) 25 MG tablet  . Cyanocobalamin (VITAMIN B-12) 500 MCG LOZG  . diphenhydramine-acetaminophen (TYLENOL PM) 25-500 MG TABS tablet  . losartan (COZAAR) 100 MG tablet  . Multiple Vitamin (MULTI-VITAMINS) TABS  . Omega-3 1000 MG CAPS  . pravastatin (PRAVACHOL) 40 MG tablet  . sertraline (ZOLOFT) 25 MG tablet  . triamcinolone (NASACORT) 55 MCG/ACT AERO nasal inhaler   No current facility-administered medications for this encounter.     Maia Plan [facility] Pre-Surgical Testing [phone] 09/12/18 2:15 PM

## 2018-09-12 NOTE — Progress Notes (Signed)
09-12-18 PCR result routed to Dr. Ronnie Derby for review

## 2018-09-13 ENCOUNTER — Other Ambulatory Visit (HOSPITAL_COMMUNITY): Payer: PPO

## 2018-10-23 DIAGNOSIS — G8929 Other chronic pain: Secondary | ICD-10-CM | POA: Diagnosis not present

## 2018-10-23 DIAGNOSIS — I712 Thoracic aortic aneurysm, without rupture: Secondary | ICD-10-CM | POA: Diagnosis not present

## 2018-10-23 DIAGNOSIS — M810 Age-related osteoporosis without current pathological fracture: Secondary | ICD-10-CM | POA: Diagnosis not present

## 2018-10-23 DIAGNOSIS — M25562 Pain in left knee: Secondary | ICD-10-CM | POA: Diagnosis not present

## 2018-10-23 DIAGNOSIS — E782 Mixed hyperlipidemia: Secondary | ICD-10-CM | POA: Diagnosis not present

## 2018-10-23 DIAGNOSIS — M8949 Other hypertrophic osteoarthropathy, multiple sites: Secondary | ICD-10-CM | POA: Diagnosis not present

## 2018-10-23 DIAGNOSIS — F325 Major depressive disorder, single episode, in full remission: Secondary | ICD-10-CM | POA: Diagnosis not present

## 2018-10-23 DIAGNOSIS — I119 Hypertensive heart disease without heart failure: Secondary | ICD-10-CM | POA: Diagnosis not present

## 2018-10-23 DIAGNOSIS — I10 Essential (primary) hypertension: Secondary | ICD-10-CM | POA: Diagnosis not present

## 2018-10-23 DIAGNOSIS — M859 Disorder of bone density and structure, unspecified: Secondary | ICD-10-CM | POA: Diagnosis not present

## 2018-10-24 NOTE — Progress Notes (Signed)
LOV DR KRASKOWSKI 05-07-18 Epic EKG 05-07-18 Epic CT CHEST WITH CONTRAST 03-15-18 Epic ECHO 05-18-18

## 2018-10-24 NOTE — Patient Instructions (Addendum)
YOU NEED TO HAVE A COVID 19 TEST ON 10-25-2018 AT 235 PM. ONCE YOUR COVID TEST IS COMPLETED, PLEASE BEGIN THE QUARANTINE INSTRUCTIONS AS OUTLINED IN YOUR HANDOUT.                Katelyn Cole    Your procedure is scheduled on: 10-29-2018   Report to [facility] Main  Entrance    Report to admitting at 730 AM    Call this number if you have problems the morning of surgery [phone]    Remember: Do not eat food or drink liquids :After Midnight. BRUSH YOUR TEETH MORNING OF SURGERY AND RINSE YOUR MOUTH OUT, NO CHEWING GUM CANDY OR MINTS.    Take these medicines the morning of surgery with A SIP OF WATER: Carvedilol, Sertraline                               You may not have any metal on your body including hair pins and              piercings  Do not wear jewelry, make-up, lotions, powders or perfumes, deodorant             Do not wear nail polish.  Do not shave  48 hours prior to surgery.         Do not bring valuables to the hospital. [place].   Contacts, dentures or bridgework may not be worn into surgery.   Leave suitcase in the car. After surgery it may be brought to your room.                  [place] - Preparing for Surgery Before surgery, you can play an important role.  Because skin is not sterile, your skin needs to be as free of germs as possible.  You can reduce the number of germs on your skin by washing with CHG (chlorahexidine gluconate) soap before surgery.  CHG is an antiseptic cleaner which kills germs and bonds with the skin to continue killing germs even after washing. Please DO NOT use if you have an allergy to CHG or antibacterial soaps.  If your skin becomes reddened/irritated stop using the CHG and inform your nurse when you arrive at Short Stay. Do not shave (including legs and underarms) for at least 48 hours prior to the first CHG shower.  You may shave your face/neck. Please follow these  instructions carefully:  1.  Shower with CHG Soap the night before surgery and the  morning of Surgery.  2.  If you choose to wash your hair, wash your hair first as usual with your  normal  shampoo.  3.  After you shampoo, rinse your hair and body thoroughly to remove the  shampoo.                             4.  Use CHG as you would any other liquid soap.  You can apply chg directly  to the skin and wash                       Gently with a scrungie or clean washcloth.  5.  Apply the CHG Soap to your body ONLY FROM THE NECK  DOWN.   Do not use on face/ open                           Wound or open sores. Avoid contact with eyes, ears mouth and genitals (private parts).                       Wash face,  Genitals (private parts) with your normal soap.             6.  Wash thoroughly, paying special attention to the area where your surgery  will be performed.  7.  Thoroughly rinse your body with warm water from the neck down.  8.  DO NOT shower/wash with your normal soap after using and rinsing off  the CHG Soap.                9.  Pat yourself dry with a clean towel.            10.  Wear clean pajamas.            11.  Place clean sheets on your bed the night of your first shower and do not  sleep with pets. Day of Surgery : Do not apply any lotions/deodorants the morning of surgery.  Please wear clean clothes to the hospital/surgery center.  FAILURE TO FOLLOW THESE INSTRUCTIONS MAY RESULT IN THE CANCELLATION OF YOUR SURGERY PATIENT SIGNATURE_________________________________  NURSE SIGNATURE__________________________________  ________________________________________________________________________

## 2018-10-25 ENCOUNTER — Other Ambulatory Visit: Payer: Self-pay | Admitting: Orthopedic Surgery

## 2018-10-25 ENCOUNTER — Other Ambulatory Visit: Payer: Self-pay

## 2018-10-25 ENCOUNTER — Other Ambulatory Visit (HOSPITAL_COMMUNITY)
Admission: RE | Admit: 2018-10-25 | Discharge: 2018-10-25 | Disposition: A | Discharge status: Home / Self Care | Payer: PPO | Source: Ambulatory Visit | Attending: Orthopedic Surgery | Admitting: Orthopedic Surgery

## 2018-10-25 ENCOUNTER — Encounter (HOSPITAL_COMMUNITY)
Admission: RE | Admit: 2018-10-25 | Discharge: 2018-10-25 | Disposition: A | Discharge status: Home / Self Care | Payer: PPO | Source: Ambulatory Visit | Attending: Orthopedic Surgery | Admitting: Orthopedic Surgery

## 2018-10-25 ENCOUNTER — Encounter (HOSPITAL_COMMUNITY): Payer: Self-pay

## 2018-10-25 DIAGNOSIS — F329 Major depressive disorder, single episode, unspecified: Secondary | ICD-10-CM | POA: Diagnosis not present

## 2018-10-25 DIAGNOSIS — Z79899 Other long term (current) drug therapy: Secondary | ICD-10-CM | POA: Insufficient documentation

## 2018-10-25 DIAGNOSIS — E785 Hyperlipidemia, unspecified: Secondary | ICD-10-CM | POA: Diagnosis not present

## 2018-10-25 DIAGNOSIS — Z85828 Personal history of other malignant neoplasm of skin: Secondary | ICD-10-CM | POA: Diagnosis not present

## 2018-10-25 DIAGNOSIS — Z7982 Long term (current) use of aspirin: Secondary | ICD-10-CM | POA: Insufficient documentation

## 2018-10-25 DIAGNOSIS — I2510 Atherosclerotic heart disease of native coronary artery without angina pectoris: Secondary | ICD-10-CM | POA: Insufficient documentation

## 2018-10-25 DIAGNOSIS — F419 Anxiety disorder, unspecified: Secondary | ICD-10-CM | POA: Diagnosis not present

## 2018-10-25 DIAGNOSIS — M1712 Unilateral primary osteoarthritis, left knee: Secondary | ICD-10-CM | POA: Diagnosis not present

## 2018-10-25 DIAGNOSIS — Z01818 Encounter for other preprocedural examination: Secondary | ICD-10-CM | POA: Diagnosis not present

## 2018-10-25 DIAGNOSIS — Z1159 Encounter for screening for other viral diseases: Secondary | ICD-10-CM | POA: Diagnosis not present

## 2018-10-25 DIAGNOSIS — I10 Essential (primary) hypertension: Secondary | ICD-10-CM | POA: Diagnosis not present

## 2018-10-25 DIAGNOSIS — I712 Thoracic aortic aneurysm, without rupture: Secondary | ICD-10-CM | POA: Diagnosis not present

## 2018-10-25 HISTORY — DX: Basal cell carcinoma of skin, unspecified: C44.91

## 2018-10-25 HISTORY — DX: Unspecified osteoarthritis, unspecified site: M19.90

## 2018-10-25 HISTORY — DX: Methicillin susceptible Staphylococcus aureus infection, unspecified site: A49.01

## 2018-10-25 HISTORY — DX: Personal history of diseases of the blood and blood-forming organs and certain disorders involving the immune mechanism: Z86.2

## 2018-10-25 LAB — CBC WITH DIFFERENTIAL/PLATELET
Abs Immature Granulocytes: 0.01 10*3/uL (ref 0.00–0.07)
Basophils Absolute: 0 10*3/uL (ref 0.0–0.1)
Basophils Relative: 0 %
Eosinophils Absolute: 0.1 10*3/uL (ref 0.0–0.5)
Eosinophils Relative: 1 %
HCT: 38.3 % (ref 36.0–46.0)
Hemoglobin: 12.1 g/dL (ref 12.0–15.0)
Immature Granulocytes: 0 %
Lymphocytes Relative: 49 %
Lymphs Abs: 3.4 10*3/uL (ref 0.7–4.0)
MCH: 30.6 pg (ref 26.0–34.0)
MCHC: 31.6 g/dL (ref 30.0–36.0)
MCV: 97 fL (ref 80.0–100.0)
Monocytes Absolute: 0.9 10*3/uL (ref 0.1–1.0)
Monocytes Relative: 13 %
Neutro Abs: 2.5 10*3/uL (ref 1.7–7.7)
Neutrophils Relative %: 37 %
Platelets: 271 10*3/uL (ref 150–400)
RBC: 3.95 MIL/uL (ref 3.87–5.11)
RDW: 12.4 % (ref 11.5–15.5)
WBC: 6.9 10*3/uL (ref 4.0–10.5)
nRBC: 0 % (ref 0.0–0.2)

## 2018-10-25 LAB — COMPREHENSIVE METABOLIC PANEL
ALT: 15 U/L (ref 0–44)
AST: 22 U/L (ref 15–41)
Albumin: 4.1 g/dL (ref 3.5–5.0)
Alkaline Phosphatase: 77 U/L (ref 38–126)
Anion gap: 9 (ref 5–15)
BUN: 14 mg/dL (ref 8–23)
CO2: 27 mmol/L (ref 22–32)
Calcium: 9.4 mg/dL (ref 8.9–10.3)
Chloride: 104 mmol/L (ref 98–111)
Creatinine, Ser: 0.67 mg/dL (ref 0.44–1.00)
GFR calc Af Amer: >60 mL/min (ref >60)
GFR calc non Af Amer: >60 mL/min (ref >60)
Glucose, Bld: 96 mg/dL (ref 70–99)
Potassium: 4 mmol/L (ref 3.5–5.1)
Sodium: 140 mmol/L (ref 135–145)
Total Bilirubin: 0.6 mg/dL (ref 0.3–1.2)
Total Protein: 6.7 g/dL (ref 6.5–8.1)

## 2018-10-25 LAB — SURGICAL PCR SCREEN
MRSA, PCR: NEGATIVE
Staphylococcus aureus: NEGATIVE

## 2018-10-25 LAB — SARS CORONAVIRUS 2 (TAT 6-24 HRS): SARS Coronavirus 2: NEGATIVE

## 2018-10-25 NOTE — Progress Notes (Signed)
SPOKE W/  Grac[place]     SCREENING SYMPTOMS OF COVID 19:   COUGH--NO  RUNNY NOSE--- NO  SORE THROAT---NO  NASAL CONGESTION----NO  SNEEZING----NO  SHORTNESS OF BREATH---NO  DIFFICULTY BREATHING---NO  TEMP >100.0 -----NO  UNEXPLAINED BODY ACHES------NO  CHILLS -------- NO  HEADACHES ---------NO  LOSS OF SMELL/ TASTE --------NO    HAVE YOU OR ANY FAMILY MEMBER TRAVELLED PAST 14 DAYS OUT OF THE   COUNTY---NO STATE----NO COUNTRY----NO  HAVE YOU OR ANY FAMILY MEMBER BEEN EXPOSED TO ANYONE WITH COVID 19? NO

## 2018-10-26 NOTE — Progress Notes (Signed)
Anesthesia Chart Review   Case: [id number] Date/Time: 10/29/18 0950   Procedure: UNICOMPARTMENTAL LEFT KNEE (Left )   Anesthesia type: Spinal   Pre-op diagnosis: LT. KNEE OSTEOARTHRITIS   [facility]   Surgeon: Vickey Huger, MD      DISCUSSION:85 y.o. never smoker with h/o PONV, CAD, HTN, anxiety, depression, HLD, thoracic AA, left knee OA scheduled for above procedure 10/29/2018 with Dr. Vickey Huger.   Pt seen by cardiologist, Dr. Jenne Campus, for preoperative evaluation on 05/07/2018.  Stress test and Echo ordered at this visit. No ischemia on Stress Test, normal EF on Echo.    Anticipate pt can proceed with planned procedure barring acute status change.  VS: BP (!) 161/78 (BP Location: Left Arm)   Pulse 61   Temp 36.6 C (Oral)   Resp 18   Ht 5\' 2"  (1.575 m)   Wt 57.7 kg   SpO2 97%   BMI 23.26 kg/m   PROVIDERS: Maris Berger, MD  Tristan Schroeder, MD is Cardiologist  LABS: Labs reviewed: Acceptable for surgery. (all labs ordered are listed, but only abnormal results are displayed)  Labs Reviewed  SURGICAL PCR SCREEN  CBC WITH DIFFERENTIAL/PLATELET  COMPREHENSIVE METABOLIC PANEL     IMAGES: CT Angio Chest 03/14/18 IMPRESSION: 1. Uncomplicated mild fusiform aneurysmal dilatation of the ascending thoracic aorta measuring 40 mm in diameter. Recommend annual imaging followup by CTA or MRA. This recommendation follows 2010 ACCF/AHA/AATS/ACR/ASA/SCA/SCAI/SIR/STS/SVM Guidelines for the Diagnosis and Management of Patients with Thoracic Aortic Disease. Circulation. 2010; 121: C947-S962. Aortic aneurysm NOS (ICD10-I71.9). 2. Uncomplicated mild fusiform ectasia of the remainder of the aortic arch and descending thoracic aorta. The descending thoracic aorta tapers to a normal caliber at the level of the diaphragmatic hiatus. 3. Cardiomegaly. 4. Coronary artery calcifications. Aortic Atherosclerosis (ICD10-I70.0).  EKG: 05/07/2018 Rate 73  bpm Normal sinus rhythm   CV: Echo 05/18/2018 IMPRESSIONS    1. The left ventricle has normal systolic function with an ejection fraction of 60-65%. The cavity size was normal. There is mildly increased left ventricular wall thickness. Left ventricular diastolic Doppler parameters are consistent with impaired  relaxation.  2. The right ventricle has normal systolic function. The cavity was normal. There is no increase in right ventricular wall thickness.  3. The mitral valve is normal in structure. Mild thickening of the mitral valve leaflet. Mild calcification of the mitral valve leaflet.  4. The tricuspid valve is normal in structure.  5. The aortic valve is tricuspid Mild thickening of the aortic valve Mild calcification of the aortic valve.  6. Aneurysm of the ascending aorta.  7. There is moderate dilatation of the ascending aorta measuring 43 mm.  Myocardial Perfusion 05/09/2018  The left ventricular ejection fraction is hyperdynamic (>65%).  Nuclear stress EF: 73%.  Blood pressure demonstrated a hypertensive response to exercise.  There was no ST segment deviation noted during stress.  Defect 1: There is a medium defect of moderate severity present in the basal anterior, mid anterior and apical anterior location.  This is a low risk study.  Noevidence of ischemia or MI. Nomal EF. Hypertensive response.    Past Medical History:  Diagnosis Date  . Aneurysm of thoracic aorta ([place]) 01/03/2013  . Anxiety 05/21/2015  . Arthritis   . Basal cell carcinoma    left leg  . CAD (coronary artery disease) 05/21/2015  . Complication of anesthesia    Difficult to awaken, spinal anesthesia cause extreme hypotension  . Depression, major,  in remission ([place]) 05/21/2015  . Dyspnea on exertion 06/23/2016  . Essential hypertension 06/23/2016  . History of iron deficiency anemia   . Hyperlipidemia 05/21/2015  . Hypertensive heart disease 05/21/2015  . Palpitations 06/23/2016  . PONV  (postoperative nausea and vomiting)   . Staph aureus infection    left leg after basal cell excision  . Syncope and collapse 06/23/2016    Past Surgical History:  Procedure Laterality Date  . ABDOMINAL HYSTERECTOMY    . BASAL CELL CARCINOMA EXCISION    . birthmark removal  Left    forearm  . BLADDER SUSPENSION    . COLONOSCOPY    . FRACTURE SURGERY Left 2007   Wrist  . NASAL SINUS SURGERY    . UPPER GI ENDOSCOPY      MEDICATIONS: . carvedilol (COREG) 6.25 MG tablet  . losartan (COZAAR) 100 MG tablet  . pravastatin (PRAVACHOL) 40 MG tablet  . sertraline (ZOLOFT) 25 MG tablet  . aspirin EC 81 MG tablet  . calcium citrate-vitamin D (CITRACAL+D) 315-200 MG-UNIT tablet  . chlorthalidone (HYGROTON) 25 MG tablet  . Cyanocobalamin (VITAMIN B-12) 500 MCG LOZG  . diphenhydramine-acetaminophen (TYLENOL PM) 25-500 MG TABS tablet  . Multiple Vitamin (MULTI-VITAMINS) TABS  . Omega-3 1000 MG CAPS   No current facility-administered medications for this encounter.    Maia Plan [facility] Pre-Surgical Testing [phone] 10/26/18  12:55 PM

## 2018-10-28 MED ORDER — BUPIVACAINE LIPOSOME 1.3 % IJ SUSP
20.0000 mL | INTRAMUSCULAR | Status: DC
  Filled 2018-10-28: qty 20

## 2018-10-29 ENCOUNTER — Ambulatory Visit (HOSPITAL_COMMUNITY): Payer: PPO | Admitting: Certified Registered Nurse Anesthetist

## 2018-10-29 ENCOUNTER — Encounter (HOSPITAL_COMMUNITY)
Admission: RE | Disposition: A | Discharge status: Home / Self Care | Payer: Self-pay | Source: Ambulatory Visit | Attending: Orthopedic Surgery

## 2018-10-29 ENCOUNTER — Other Ambulatory Visit: Payer: Self-pay

## 2018-10-29 ENCOUNTER — Encounter (HOSPITAL_COMMUNITY): Payer: Self-pay | Admitting: Certified Registered Nurse Anesthetist

## 2018-10-29 ENCOUNTER — Observation Stay (HOSPITAL_COMMUNITY)
Admission: RE | Admit: 2018-10-29 | Discharge: 2018-10-30 | Disposition: A | Discharge status: Home / Self Care | Payer: PPO | Source: Ambulatory Visit | Attending: Orthopedic Surgery | Admitting: Orthopedic Surgery

## 2018-10-29 ENCOUNTER — Ambulatory Visit (HOSPITAL_COMMUNITY): Payer: PPO | Admitting: Physician Assistant

## 2018-10-29 DIAGNOSIS — Z96652 Presence of left artificial knee joint: Secondary | ICD-10-CM

## 2018-10-29 DIAGNOSIS — Z79899 Other long term (current) drug therapy: Secondary | ICD-10-CM | POA: Diagnosis not present

## 2018-10-29 DIAGNOSIS — I10 Essential (primary) hypertension: Secondary | ICD-10-CM | POA: Diagnosis not present

## 2018-10-29 DIAGNOSIS — Z85828 Personal history of other malignant neoplasm of skin: Secondary | ICD-10-CM | POA: Insufficient documentation

## 2018-10-29 DIAGNOSIS — M1712 Unilateral primary osteoarthritis, left knee: Principal | ICD-10-CM | POA: Insufficient documentation

## 2018-10-29 DIAGNOSIS — I2510 Atherosclerotic heart disease of native coronary artery without angina pectoris: Secondary | ICD-10-CM | POA: Insufficient documentation

## 2018-10-29 DIAGNOSIS — E785 Hyperlipidemia, unspecified: Secondary | ICD-10-CM | POA: Diagnosis not present

## 2018-10-29 DIAGNOSIS — F419 Anxiety disorder, unspecified: Secondary | ICD-10-CM | POA: Diagnosis not present

## 2018-10-29 DIAGNOSIS — G8918 Other acute postprocedural pain: Secondary | ICD-10-CM | POA: Diagnosis not present

## 2018-10-29 DIAGNOSIS — Z7982 Long term (current) use of aspirin: Secondary | ICD-10-CM | POA: Diagnosis not present

## 2018-10-29 HISTORY — PX: PARTIAL KNEE ARTHROPLASTY: SHX2174

## 2018-10-29 SURGERY — ARTHROPLASTY, KNEE, UNICOMPARTMENTAL
Anesthesia: Spinal | Laterality: Left

## 2018-10-29 MED ORDER — PHENOL 1.4 % MT LIQD: 1.0000 | OROMUCOSAL | Status: DC | PRN

## 2018-10-29 MED ORDER — ACETAMINOPHEN 500 MG PO TABS
1000.0000 mg | ORAL_TABLET | Freq: Four times a day (QID) | ORAL | Status: AC
  Administered 2018-10-29 – 2018-10-30 (×4): 1000 mg via ORAL
  Filled 2018-10-29 (×4): qty 2

## 2018-10-29 MED ORDER — LACTATED RINGERS IV SOLN
INTRAVENOUS | Status: DC
  Administered 2018-10-29 (×2): via INTRAVENOUS

## 2018-10-29 MED ORDER — LOSARTAN POTASSIUM 50 MG PO TABS
100.0000 mg | ORAL_TABLET | Freq: Every day | ORAL | Status: DC
  Filled 2018-10-29: qty 2

## 2018-10-29 MED ORDER — ACETAMINOPHEN 500 MG PO TABS
1000.0000 mg | ORAL_TABLET | Freq: Once | ORAL | Status: AC
  Administered 2018-10-29: 08:00:00 1000 mg via ORAL
  Filled 2018-10-29: qty 2

## 2018-10-29 MED ORDER — TRANEXAMIC ACID-NACL 1000-0.7 MG/100ML-% IV SOLN
1000.0000 mg | Freq: Once | INTRAVENOUS | Status: AC
  Administered 2018-10-29: 15:00:00 1000 mg via INTRAVENOUS
  Filled 2018-10-29: qty 100

## 2018-10-29 MED ORDER — FENTANYL CITRATE (PF) 100 MCG/2ML IJ SOLN: 25.0000 ug | INTRAMUSCULAR | Status: DC | PRN

## 2018-10-29 MED ORDER — LIDOCAINE 2% (20 MG/ML) 5 ML SYRINGE
INTRAMUSCULAR | Status: DC | PRN
  Administered 2018-10-29: 60 mg via INTRAVENOUS

## 2018-10-29 MED ORDER — BUPIVACAINE-EPINEPHRINE (PF) 0.25% -1:200000 IJ SOLN
INTRAMUSCULAR | Status: AC
  Filled 2018-10-29: qty 30

## 2018-10-29 MED ORDER — ONDANSETRON HCL 4 MG/2ML IJ SOLN: 4.0000 mg | Freq: Once | INTRAMUSCULAR | Status: DC | PRN

## 2018-10-29 MED ORDER — OXYCODONE HCL 5 MG/5ML PO SOLN: 5.0000 mg | Freq: Once | ORAL | Status: DC | PRN

## 2018-10-29 MED ORDER — CHLORHEXIDINE GLUCONATE 4 % EX LIQD: 60.0000 mL | Freq: Once | CUTANEOUS | Status: DC

## 2018-10-29 MED ORDER — METHOCARBAMOL 1000 MG/10ML IJ SOLN
500.0000 mg | Freq: Four times a day (QID) | INTRAVENOUS | Status: DC | PRN
  Filled 2018-10-29: qty 5

## 2018-10-29 MED ORDER — CEFAZOLIN SODIUM-DEXTROSE 2-4 GM/100ML-% IV SOLN
2.0000 g | Freq: Four times a day (QID) | INTRAVENOUS | Status: AC
  Administered 2018-10-29 (×2): 2 g via INTRAVENOUS
  Filled 2018-10-29 (×2): qty 100

## 2018-10-29 MED ORDER — PROPOFOL 10 MG/ML IV BOLUS
INTRAVENOUS | Status: DC | PRN
  Administered 2018-10-29: 120 mg via INTRAVENOUS

## 2018-10-29 MED ORDER — HYDRALAZINE HCL 20 MG/ML IJ SOLN
5.0000 mg | INTRAMUSCULAR | Status: DC | PRN
  Administered 2018-10-29: 14:00:00 5 mg via INTRAVENOUS

## 2018-10-29 MED ORDER — ZOLPIDEM TARTRATE 5 MG PO TABS: 5.0000 mg | ORAL_TABLET | Freq: Every evening | ORAL | Status: DC | PRN

## 2018-10-29 MED ORDER — HYDRALAZINE HCL 20 MG/ML IJ SOLN
INTRAMUSCULAR | Status: AC
  Filled 2018-10-29: qty 1

## 2018-10-29 MED ORDER — SODIUM CHLORIDE 0.9% FLUSH
INTRAVENOUS | Status: DC | PRN
  Administered 2018-10-29: 20 mL

## 2018-10-29 MED ORDER — LIDOCAINE 2% (20 MG/ML) 5 ML SYRINGE
INTRAMUSCULAR | Status: AC
  Filled 2018-10-29: qty 5

## 2018-10-29 MED ORDER — TRANEXAMIC ACID-NACL 1000-0.7 MG/100ML-% IV SOLN
1000.0000 mg | INTRAVENOUS | Status: AC
  Administered 2018-10-29: 11:00:00 1000 mg via INTRAVENOUS
  Filled 2018-10-29: qty 100

## 2018-10-29 MED ORDER — OXYCODONE HCL 5 MG PO TABS: 5.0000 mg | ORAL_TABLET | Freq: Once | ORAL | Status: DC | PRN

## 2018-10-29 MED ORDER — METOCLOPRAMIDE HCL 5 MG/ML IJ SOLN: 5.0000 mg | Freq: Three times a day (TID) | INTRAMUSCULAR | Status: DC | PRN

## 2018-10-29 MED ORDER — OXYCODONE HCL 5 MG PO TABS
5.0000 mg | ORAL_TABLET | ORAL | Status: DC | PRN
  Administered 2018-10-29: 10 mg via ORAL
  Filled 2018-10-29: qty 2

## 2018-10-29 MED ORDER — CELECOXIB 200 MG PO CAPS
400.0000 mg | ORAL_CAPSULE | Freq: Once | ORAL | Status: AC
  Administered 2018-10-29: 08:00:00 400 mg via ORAL
  Filled 2018-10-29: qty 2

## 2018-10-29 MED ORDER — EPHEDRINE 5 MG/ML INJ
INTRAVENOUS | Status: AC
  Filled 2018-10-29: qty 10

## 2018-10-29 MED ORDER — DOCUSATE SODIUM 100 MG PO CAPS
100.0000 mg | ORAL_CAPSULE | Freq: Two times a day (BID) | ORAL | Status: DC
  Administered 2018-10-30: 09:00:00 100 mg via ORAL
  Filled 2018-10-29 (×3): qty 1

## 2018-10-29 MED ORDER — GLYCOPYRROLATE PF 0.2 MG/ML IJ SOSY
PREFILLED_SYRINGE | INTRAMUSCULAR | Status: AC
  Filled 2018-10-29: qty 1

## 2018-10-29 MED ORDER — PHENYLEPHRINE 40 MCG/ML (10ML) SYRINGE FOR IV PUSH (FOR BLOOD PRESSURE SUPPORT)
PREFILLED_SYRINGE | INTRAVENOUS | Status: AC
  Filled 2018-10-29: qty 10

## 2018-10-29 MED ORDER — DEXAMETHASONE SODIUM PHOSPHATE 10 MG/ML IJ SOLN
10.0000 mg | Freq: Once | INTRAMUSCULAR | Status: AC
  Administered 2018-10-30: 09:00:00 10 mg via INTRAVENOUS
  Filled 2018-10-29: qty 1

## 2018-10-29 MED ORDER — PANTOPRAZOLE SODIUM 40 MG PO TBEC
40.0000 mg | DELAYED_RELEASE_TABLET | Freq: Every day | ORAL | Status: DC
  Administered 2018-10-30: 09:00:00 40 mg via ORAL
  Filled 2018-10-29: qty 1

## 2018-10-29 MED ORDER — PROPOFOL 10 MG/ML IV BOLUS
INTRAVENOUS | Status: AC
  Filled 2018-10-29: qty 40

## 2018-10-29 MED ORDER — PHENYLEPHRINE HCL (PRESSORS) 10 MG/ML IV SOLN
INTRAVENOUS | Status: AC
  Filled 2018-10-29: qty 1

## 2018-10-29 MED ORDER — DEXAMETHASONE SODIUM PHOSPHATE 10 MG/ML IJ SOLN
8.0000 mg | Freq: Once | INTRAMUSCULAR | Status: AC
  Administered 2018-10-29: 8 mg via INTRAVENOUS

## 2018-10-29 MED ORDER — FENTANYL CITRATE (PF) 250 MCG/5ML IJ SOLN
INTRAMUSCULAR | Status: DC | PRN
  Administered 2018-10-29 (×3): 50 ug via INTRAVENOUS

## 2018-10-29 MED ORDER — MENTHOL 3 MG MT LOZG: 1.0000 | LOZENGE | OROMUCOSAL | Status: DC | PRN

## 2018-10-29 MED ORDER — BUPIVACAINE LIPOSOME 1.3 % IJ SUSP
INTRAMUSCULAR | Status: DC | PRN
  Administered 2018-10-29: 20 mL

## 2018-10-29 MED ORDER — DIPHENHYDRAMINE HCL 12.5 MG/5ML PO ELIX: 12.5000 mg | ORAL_SOLUTION | ORAL | Status: DC | PRN

## 2018-10-29 MED ORDER — BISACODYL 5 MG PO TBEC: 5.0000 mg | DELAYED_RELEASE_TABLET | Freq: Every day | ORAL | Status: DC | PRN

## 2018-10-29 MED ORDER — SODIUM CHLORIDE 0.9 % IV SOLN
INTRAVENOUS | Status: DC
  Administered 2018-10-29 – 2018-10-30 (×2): via INTRAVENOUS

## 2018-10-29 MED ORDER — GABAPENTIN 300 MG PO CAPS
300.0000 mg | ORAL_CAPSULE | Freq: Once | ORAL | Status: AC
  Administered 2018-10-29: 08:00:00 300 mg via ORAL
  Filled 2018-10-29: qty 1

## 2018-10-29 MED ORDER — ONDANSETRON HCL 4 MG/2ML IJ SOLN
INTRAMUSCULAR | Status: DC | PRN
  Administered 2018-10-29: 4 mg via INTRAVENOUS

## 2018-10-29 MED ORDER — MIDAZOLAM HCL 2 MG/2ML IJ SOLN: 1.0000 mg | INTRAMUSCULAR | Status: DC

## 2018-10-29 MED ORDER — HYDROMORPHONE HCL 1 MG/ML IJ SOLN: 0.5000 mg | INTRAMUSCULAR | Status: DC | PRN

## 2018-10-29 MED ORDER — FENTANYL CITRATE (PF) 250 MCG/5ML IJ SOLN
INTRAMUSCULAR | Status: AC
  Filled 2018-10-29: qty 5

## 2018-10-29 MED ORDER — ONDANSETRON HCL 4 MG/2ML IJ SOLN: 4.0000 mg | Freq: Four times a day (QID) | INTRAMUSCULAR | Status: DC | PRN

## 2018-10-29 MED ORDER — METOCLOPRAMIDE HCL 5 MG PO TABS: 5.0000 mg | ORAL_TABLET | Freq: Three times a day (TID) | ORAL | Status: DC | PRN

## 2018-10-29 MED ORDER — SENNOSIDES-DOCUSATE SODIUM 8.6-50 MG PO TABS: 1.0000 | ORAL_TABLET | Freq: Every evening | ORAL | Status: DC | PRN

## 2018-10-29 MED ORDER — POVIDONE-IODINE 10 % EX SWAB
2.0000 "application " | Freq: Once | CUTANEOUS | Status: AC
  Administered 2018-10-29: 2 via TOPICAL

## 2018-10-29 MED ORDER — TRAMADOL HCL 50 MG PO TABS
50.0000 mg | ORAL_TABLET | Freq: Four times a day (QID) | ORAL | Status: DC
  Administered 2018-10-29 – 2018-10-30 (×4): 50 mg via ORAL
  Filled 2018-10-29 (×4): qty 1

## 2018-10-29 MED ORDER — SODIUM CHLORIDE 0.9 % IR SOLN
Status: DC | PRN
  Administered 2018-10-29: 1000 mL

## 2018-10-29 MED ORDER — ONDANSETRON HCL 4 MG/2ML IJ SOLN
INTRAMUSCULAR | Status: AC
  Filled 2018-10-29: qty 2

## 2018-10-29 MED ORDER — SODIUM CHLORIDE (PF) 0.9 % IJ SOLN
INTRAMUSCULAR | Status: AC
  Filled 2018-10-29: qty 20

## 2018-10-29 MED ORDER — PRAVASTATIN SODIUM 20 MG PO TABS
40.0000 mg | ORAL_TABLET | Freq: Every day | ORAL | Status: DC
  Administered 2018-10-29: 21:00:00 40 mg via ORAL
  Filled 2018-10-29: qty 2

## 2018-10-29 MED ORDER — HYDRALAZINE HCL 20 MG/ML IJ SOLN
5.0000 mg | Freq: Once | INTRAMUSCULAR | Status: AC
  Administered 2018-10-29: 13:00:00 5 mg via INTRAVENOUS

## 2018-10-29 MED ORDER — EPHEDRINE SULFATE-NACL 50-0.9 MG/10ML-% IV SOSY
PREFILLED_SYRINGE | INTRAVENOUS | Status: DC | PRN
  Administered 2018-10-29 (×2): 5 mg via INTRAVENOUS
  Administered 2018-10-29 (×3): 10 mg via INTRAVENOUS
  Administered 2018-10-29: 5 mg via INTRAVENOUS
  Administered 2018-10-29: 10 mg via INTRAVENOUS

## 2018-10-29 MED ORDER — CHLORTHALIDONE 25 MG PO TABS
12.5000 mg | ORAL_TABLET | Freq: Every day | ORAL | Status: DC
  Filled 2018-10-29: qty 1

## 2018-10-29 MED ORDER — DEXAMETHASONE SODIUM PHOSPHATE 10 MG/ML IJ SOLN
INTRAMUSCULAR | Status: AC
  Filled 2018-10-29: qty 1

## 2018-10-29 MED ORDER — ALUM & MAG HYDROXIDE-SIMETH 200-200-20 MG/5ML PO SUSP: 30.0000 mL | ORAL | Status: DC | PRN

## 2018-10-29 MED ORDER — FERROUS SULFATE 325 (65 FE) MG PO TABS
325.0000 mg | ORAL_TABLET | Freq: Three times a day (TID) | ORAL | Status: DC
  Administered 2018-10-29 – 2018-10-30 (×3): 325 mg via ORAL
  Filled 2018-10-29 (×3): qty 1

## 2018-10-29 MED ORDER — CARVEDILOL 6.25 MG PO TABS
6.2500 mg | ORAL_TABLET | Freq: Two times a day (BID) | ORAL | Status: DC
  Administered 2018-10-29 – 2018-10-30 (×2): 6.25 mg via ORAL
  Filled 2018-10-29 (×2): qty 1

## 2018-10-29 MED ORDER — FENTANYL CITRATE (PF) 100 MCG/2ML IJ SOLN
50.0000 ug | INTRAMUSCULAR | Status: DC
  Administered 2018-10-29: 10:00:00 50 ug via INTRAVENOUS
  Filled 2018-10-29: qty 2

## 2018-10-29 MED ORDER — ONDANSETRON HCL 4 MG PO TABS: 4.0000 mg | ORAL_TABLET | Freq: Four times a day (QID) | ORAL | Status: DC | PRN

## 2018-10-29 MED ORDER — CEFAZOLIN SODIUM-DEXTROSE 2-4 GM/100ML-% IV SOLN
2.0000 g | INTRAVENOUS | Status: AC
  Administered 2018-10-29: 11:00:00 2 g via INTRAVENOUS
  Filled 2018-10-29: qty 100

## 2018-10-29 MED ORDER — METHOCARBAMOL 500 MG PO TABS
500.0000 mg | ORAL_TABLET | Freq: Four times a day (QID) | ORAL | Status: DC | PRN
  Administered 2018-10-29: 21:00:00 500 mg via ORAL
  Filled 2018-10-29: qty 1

## 2018-10-29 MED ORDER — SERTRALINE HCL 25 MG PO TABS
25.0000 mg | ORAL_TABLET | Freq: Every day | ORAL | Status: DC
  Administered 2018-10-30: 09:00:00 25 mg via ORAL
  Filled 2018-10-29: qty 1

## 2018-10-29 MED ORDER — GABAPENTIN 300 MG PO CAPS
300.0000 mg | ORAL_CAPSULE | Freq: Three times a day (TID) | ORAL | Status: DC
  Administered 2018-10-29 – 2018-10-30 (×4): 300 mg via ORAL
  Filled 2018-10-29 (×4): qty 1

## 2018-10-29 MED ORDER — FLEET ENEMA 7-19 GM/118ML RE ENEM: 1.0000 | ENEMA | Freq: Once | RECTAL | Status: DC | PRN

## 2018-10-29 MED ORDER — ASPIRIN EC 325 MG PO TBEC
325.0000 mg | DELAYED_RELEASE_TABLET | Freq: Two times a day (BID) | ORAL | Status: DC
  Administered 2018-10-30: 325 mg via ORAL
  Filled 2018-10-29: qty 1

## 2018-10-29 MED ORDER — BUPIVACAINE-EPINEPHRINE 0.25% -1:200000 IJ SOLN
INTRAMUSCULAR | Status: DC | PRN
  Administered 2018-10-29: 20 mL

## 2018-10-29 SURGICAL SUPPLY — 52 items
BAG ZIPLOCK 12X15 (MISCELLANEOUS) ×2 IMPLANT
BEARING TIBIAL UKA SZ2 10MM (Knees) IMPLANT
BLADE SAW RECIPROCATING 77.5 (BLADE) ×2 IMPLANT
BLADE SAW SAG 90X13X1.27 (BLADE) ×2 IMPLANT
BNDG ELASTIC 4X5.8 VLCR STR LF (GAUZE/BANDAGES/DRESSINGS) ×1 IMPLANT
BNDG ELASTIC 6X5.8 VLCR STR LF (GAUZE/BANDAGES/DRESSINGS) ×2 IMPLANT
BOWL SMART MIX CTS (DISPOSABLE) ×2 IMPLANT
CEMENT BONE SIMPLEX SPEEDSET (Cement) ×2 IMPLANT
COMP FEMUR SZ2 IBAL LT MED (Knees) ×2 IMPLANT
COMPONENT FEMUR SZ2 IBALLT MED (Knees) IMPLANT
COVER SURGICAL LIGHT HANDLE (MISCELLANEOUS) ×2 IMPLANT
COVER WAND RF STERILE (DRAPES) IMPLANT
CUFF TOURN SGL QUICK 34 (TOURNIQUET CUFF) ×1
CUFF TRNQT CYL 34X4.125X (TOURNIQUET CUFF) ×1 IMPLANT
DECANTER SPIKE VIAL GLASS SM (MISCELLANEOUS) ×3 IMPLANT
DRAPE INCISE IOBAN 66X45 STRL (DRAPES) ×4 IMPLANT
DRSG AQUACEL AG ADV 3.5X 6 (GAUZE/BANDAGES/DRESSINGS) ×2 IMPLANT
DURAPREP 26ML APPLICATOR (WOUND CARE) ×2 IMPLANT
ELECT REM PT RETURN 15FT ADLT (MISCELLANEOUS) ×2 IMPLANT
GLOVE BIOGEL M STRL SZ7.5 (GLOVE) ×2 IMPLANT
GLOVE BIOGEL PI IND STRL 7.5 (GLOVE) ×1 IMPLANT
GLOVE BIOGEL PI IND STRL 8.5 (GLOVE) ×2 IMPLANT
GLOVE BIOGEL PI INDICATOR 7.5 (GLOVE) ×1
GLOVE BIOGEL PI INDICATOR 8.5 (GLOVE) ×2
GLOVE SURG ORTHO 8.0 STRL STRW (GLOVE) ×6 IMPLANT
GOWN STRL REUS W/TWL 2XL LVL3 (GOWN DISPOSABLE) ×2 IMPLANT
GOWN STRL REUS W/TWL XL LVL3 (GOWN DISPOSABLE) ×4 IMPLANT
HANDPIECE INTERPULSE COAX TIP (DISPOSABLE) ×1
HOLDER FOLEY CATH W/STRAP (MISCELLANEOUS) IMPLANT
HOOD PEEL AWAY FLYTE STAYCOOL (MISCELLANEOUS) ×6 IMPLANT
KIT TURNOVER KIT A (KITS) IMPLANT
MANIFOLD NEPTUNE II (INSTRUMENTS) ×2 IMPLANT
NS IRRIG 1000ML POUR BTL (IV SOLUTION) ×2 IMPLANT
PACK TOTAL KNEE CUSTOM (KITS) ×2 IMPLANT
SET HNDPC FAN SPRY TIP SCT (DISPOSABLE) ×1 IMPLANT
STRIP CLOSURE SKIN 1/2X4 (GAUZE/BANDAGES/DRESSINGS) ×2 IMPLANT
SUT MNCRL AB 4-0 PS2 18 (SUTURE) ×2 IMPLANT
SUT STRATAFIX 0 PDS 27 VIOLET (SUTURE) ×2
SUT STRATAFIX PDS+ 0 24IN (SUTURE) ×2 IMPLANT
SUT VIC AB 0 CT1 27 (SUTURE) ×1
SUT VIC AB 0 CT1 27XBRD ANBCTR (SUTURE) ×1 IMPLANT
SUT VIC AB 1 CT1 36 (SUTURE) ×2 IMPLANT
SUT VIC AB 2-0 CT1 27 (SUTURE) ×1
SUT VIC AB 2-0 CT1 TAPERPNT 27 (SUTURE) ×1 IMPLANT
SUTURE STRATFX 0 PDS 27 VIOLET (SUTURE) ×1 IMPLANT
SYR CONTROL 10ML LL (SYRINGE) ×2 IMPLANT
TIBIAL BEARING UKA SZ2 10MM (Knees) ×2 IMPLANT
TRAY FOLEY MTR SLVR 16FR STAT (SET/KITS/TRAYS/PACK) ×2 IMPLANT
TRAY TIB CMNT UKA SZ 2 KNEE (Miscellaneous) ×1 IMPLANT
WATER STERILE IRR 1000ML POUR (IV SOLUTION) ×3 IMPLANT
WRAP KNEE MAXI GEL POST OP (GAUZE/BANDAGES/DRESSINGS) ×1 IMPLANT
YANKAUER SUCT BULB TIP 10FT TU (MISCELLANEOUS) ×2 IMPLANT

## 2018-10-29 NOTE — Anesthesia Procedure Notes (Signed)
Procedure Name: LMA Insertion Date/Time: 10/29/2018 10:52 AM Performed by: Maxwell Caul, CRNA Pre-anesthesia Checklist: Patient identified, Emergency Drugs available, Suction available and Patient being monitored Patient Re-evaluated:Patient Re-evaluated prior to induction Oxygen Delivery Method: Circle system utilized Preoxygenation: Pre-oxygenation with 100% oxygen Induction Type: IV induction LMA: LMA inserted LMA Size: 4.0 Number of attempts: 1 Placement Confirmation: positive ETCO2 and breath sounds checked- equal and bilateral Tube secured with: Tape Dental Injury: Teeth and Oropharynx as per pre-operative assessment

## 2018-10-29 NOTE — Op Note (Signed)
UNI KNEE REPLACEMENT OPERATIVE NOTE:  10/29/2018  1:18 PM  PATIENT:  Katelyn Cole  85 y.o. female  PRE-OPERATIVE DIAGNOSIS:  LT. KNEE OSTEOARTHRITIS  POST-OPERATIVE DIAGNOSIS:  LT. KNEE OSTEOARTHRITIS  PROCEDURE:  Procedure(s): UNICOMPARTMENTAL LEFT KNEE  SURGEON:  Surgeon(s): Vickey Huger, MD  PHYSICIAN ASSISTANT:  Nehemiah Massed PA-C  ANESTHESIA:   spinal  DRAINS: Hemovac  SPECIMEN: None  COUNTS:  Correct  TOURNIQUET:   Total Tourniquet Time Documented: Thigh (Left) - 39 minutes Total: Thigh (Left) - 39 minutes   DICTATION:  Indication for procedure:    The patient is a 85 y.o. female who has failed conservative treatment for LT. KNEE OSTEOARTHRITIS.  Informed consent was obtained prior to anesthesia. The risks versus benefits of the operation were explain and in a way the patient can, and did, understand.    Description of procedure:     The patient was taken to the operating room and placed under anesthesia.  The patient was positioned in the usual fashion taking care that all body parts were adequately padded and/or protected.  I foley catheter was placed.  A tourniquet was applied and the leg prepped and draped in the usual sterile fashion.  The extremity was exsanguinated with the esmarch and tourniquet inflated to 350 mmHg.  Pre-operative range of motion was normal.  The knee was in 4 degree of mild varus.  A midline incision approximately 3-4 inches long was made with a #10 blade.  A new blade was used to make a parapatellar arthrotomy going 1 cm into the quadriceps tendon, over the patella, and alongside the medial aspect of the patellar tendon.  A synovectomy was then performed with the #10 blade and forceps. I then elevated the deep MCL off the medial tibial flare. The knee was put at 90 degrees and the patient specific cutting blocks were used to make our proximal tibial cut and distal femoral cut. The medial meniscus was removed at this point.  I then used  the 2 cutting guide on the femur to drill for lugs and cut the chamfers. Likewise, a 2 tibial baseplate was used to prepare the tibia. I then trialed the  2 femur and 2 tibia. I trialed several poly inserts and a 10 mm achieved good balance in flexion and extension.  I then irrigated copiously and then mixed the cement. I injected exparel in the deep soft tissues at this point. I then cemented the tibia first followed by the femur and removed excess cement and then inserted the polyethylene. I placed the leg in extension and finished injecting the rest of the exparel. Once the cement was hard, the tourniquet was let down. Hemostasis was obtained. The arthrotomy was closed using a #1 stratofix running suture. The deep soft tissue was closed with #0 vicryls and the subcuticular layer closed with a #2.0 vicryl. The skin was reapproximated and closed using a running 3.0 monocryl. The wound was covered with steristrips, aquacel dressing, and a TED stocking. The patient was awakened, extubated, and taken to recovery in a stable condition.   BLOOD LOSS:  591MB COMPLICATIONS:  None.  PLAN OF CARE: Admit for overnight observation  PATIENT DISPOSITION:  PACU - hemodynamically stable.    Please fax a copy of this op note to my office at [phone] (please only include page 1 and 2 of the Case Information op note)

## 2018-10-29 NOTE — Progress Notes (Signed)
AssistedDr. Joslin with left, ultrasound guided, adductor canal block. Side rails up, monitors on throughout procedure. See vital signs in flow sheet. Tolerated Procedure well.  

## 2018-10-29 NOTE — Anesthesia Procedure Notes (Signed)
Anesthesia Regional Block: Adductor canal block   Pre-Anesthetic Checklist: ,, timeout performed, Correct Patient, Correct Site, Correct Laterality, Correct Procedure, Correct Position, site marked, Risks and benefits discussed, pre-op evaluation,  At surgeon's request and post-op pain management  Laterality: Left  Prep: Maximum Sterile Barrier Precautions used, chloraprep       Needles:  Injection technique: Single-shot  Needle Type: Echogenic Stimulator Needle     Needle Length: 9cm  Needle Gauge: 21     Additional Needles:   Procedures:,,,, ultrasound used (permanent image in chart),,,,  Narrative:  Start time: 10/29/2018 10:20 AM End time: 10/29/2018 10:25 AM Injection made incrementally with aspirations every 5 mL.  Performed by: Personally  Anesthesiologist: Roberts Gaudy, MD  Additional Notes: 20 cc 0.75% Ropivacaine injected easily

## 2018-10-29 NOTE — Evaluation (Signed)
Physical Therapy Evaluation Patient Details Name: Katelyn Cole MRN: [id number] DOB: Feb 16, 1931 Today's Date: 10/29/2018   History of Present Illness  s/p L UKA. PMH: CAD, HTN  Clinical Impression  Pt is s/p TKA resulting in the deficits listed below (see PT Problem List).  Pt amb ~ 49' with RW and min/guard assist, anticipate steady progress on acute.   Pt will benefit from skilled PT to increase their independence and safety with mobility to allow discharge to the venue listed below.      Follow Up Recommendations Follow surgeon's recommendation for DC plan and follow-up therapies    Equipment Recommendations       Recommendations for Other Services       Precautions / Restrictions Precautions Precautions: Fall;Knee Restrictions Weight Bearing Restrictions: No Other Position/Activity Restrictions: WBAT      Mobility  Bed Mobility Overal bed mobility: Needs Assistance Bed Mobility: Sit to Supine       Sit to supine: Min guard   General bed mobility comments: for safety  Transfers Overall transfer level: Needs assistance Equipment used: Rolling walker (2 wheeled) Transfers: Sit to/from Stand Sit to Stand: Min guard         General transfer comment: cues for hand placement and LLE position  Ambulation/Gait Ambulation/Gait assistance: Min guard Gait Distance (Feet): 80 Feet Assistive device: Rolling walker (2 wheeled) Gait Pattern/deviations: Step-to pattern;Step-through pattern     General Gait Details: cues for sequence and RW safety  Stairs            Wheelchair Mobility    Modified Rankin (Stroke Patients Only)       Balance                                             Pertinent Vitals/Pain Pain Assessment: 0-10 Pain Score: 3  Pain Location: left knee Pain Descriptors / Indicators: Grimacing;Sore Pain Intervention(s): Limited activity within patient's tolerance;Monitored during session;Premedicated before  session;Repositioned;Ice applied    Home Living Family/patient expects to be discharged to:: Private residence Living Arrangements: Alone Available Help at Discharge: Family;Available 24 hours/day(going to her dtr's house) Type of Home: House Home Access: Stairs to enter Entrance Stairs-Rails: Right Entrance Stairs-Number of Steps: 2 Home Layout: One level Home Equipment: [place] - 2 wheels;Cane - single point;Bedside commode      Prior Function Level of Independence: Independent         Comments: enjoys gardening     Hand Dominance        Extremity/Trunk Assessment   Upper Extremity Assessment Upper Extremity Assessment: Overall WFL for tasks assessed    Lower Extremity Assessment Lower Extremity Assessment: LLE deficits/detail LLE Deficits / Details: ankle WFL. knee and hip grossly 3/5;  AROM grossly 5 to 70 degrees       Communication   Communication: No difficulties  Cognition Arousal/Alertness: Awake/alert Behavior During Therapy: WFL for tasks assessed/performed Overall Cognitive Status: Within Functional Limits for tasks assessed                                        General Comments      Exercises Total Joint Exercises Ankle Circles/Pumps: AROM;10 reps;Both Quad Sets: 10 reps;AROM;Both Heel Slides: AROM;Left;5 reps   Assessment/Plan    PT Assessment Patient needs continued PT  services  PT Problem List Decreased strength;Decreased range of motion;Decreased activity tolerance;Decreased knowledge of use of DME;Decreased mobility;Pain;Decreased knowledge of precautions       PT Treatment Interventions DME instruction;Gait training;Functional mobility training;Therapeutic activities;Patient/family education;Therapeutic exercise;Stair training    PT Goals (Current goals can be found in the Care Plan section)  Acute Rehab PT Goals Patient Stated Goal: get back to gardening PT Goal Formulation: With patient Time For Goal  Achievement: 11/05/18 Potential to Achieve Goals: Good    Frequency 7X/week   Barriers to discharge        Co-evaluation               AM-PAC PT "6 Clicks" Mobility  Outcome Measure Help needed turning from your back to your side while in a flat bed without using bedrails?: A Little Help needed moving from lying on your back to sitting on the side of a flat bed without using bedrails?: A Little Help needed moving to and from a bed to a chair (including a wheelchair)?: A Little Help needed standing up from a chair using your arms (e.g., wheelchair or bedside chair)?: A Little Help needed to walk in hospital room?: A Little Help needed climbing 3-5 steps with a railing? : A Little 6 Click Score: 18    End of Session Equipment Utilized During Treatment: Gait belt Activity Tolerance: Patient tolerated treatment well Patient left: with call bell/phone within reach;in bed;with bed alarm set   PT Visit Diagnosis: Difficulty in walking, not elsewhere classified (R26.2)    Time: 0881-1031 PT Time Calculation (min) (ACUTE ONLY): 21 min   Charges:   PT Evaluation $PT Eval Low Complexity: 1 Low          Kenyon Ana, PT  Pager: [phone] Acute Rehab Dept [facility]): [phone]   10/29/2018   [facility] 10/29/2018, 7:25 PM

## 2018-10-29 NOTE — Progress Notes (Signed)
SPORTS MEDICINE AND JOINT REPLACEMENT  Lara Mulch, MD    Carlyon Shadow, PA-C [place], [place],   [address]                             [phone]   PROGRESS NOTE  Subjective:  negative for Chest Pain  negative for Shortness of Breath  negative for Nausea/Vomiting   negative for Calf Pain  negative for Bowel Movement   Tolerating Diet: yes         Patient reports pain as 3 on 0-10 scale.    Objective: Vital signs in last 24 hours:    Patient Vitals for the past 24 hrs:  BP Temp Temp src Pulse Resp SpO2 Height Weight  10/29/18 1805 122/60 98 F (36.7 C) - 64 16 99 % - -  10/29/18 1707 - - - - - - 5\' 2"  (1.575 m) 59.7 kg  10/29/18 1622 (!) 148/77 97.6 F (36.4 C) - 67 16 97 % - -  10/29/18 1434 125/60 - - 70 12 - - -  10/29/18 1415 (!) 128/59 - - 69 11 99 % - -  10/29/18 1400 126/61 - - 68 11 97 % - -  10/29/18 1345 (!) 135/59 - - 63 11 97 % - -  10/29/18 1330 (!) 165/78 97.6 F (36.4 C) - (!) 59 (!) 7 98 % - -  10/29/18 1315 (!) 167/75 - - 62 11 96 % - -  10/29/18 1300 (!) 183/84 - - 65 (!) 9 100 % - -  10/29/18 1245 (!) 177/81 - - 66 (!) 9 100 % - -  10/29/18 1234 (!) 170/82 97.6 F (36.4 C) - 66 (!) 8 99 % - -  10/29/18 1031 (!) 141/70 - - (!) 49 10 100 % - -  10/29/18 1027 (!) 151/64 - - (!) 49 11 100 % - -  10/29/18 1017 (!) 163/76 - - (!) 50 14 100 % - -  10/29/18 0805 136/71 98.4 F (36.9 C) Oral 62 18 98 % - -    @flow {1959:LAST@   Intake/Output from previous day:   No intake/output data recorded.   Intake/Output this shift:   No intake/output data recorded.   Intake/Output      07/27 0701 - 07/28 0700   P.O. 240   I.V. (mL/kg) 1311.6 (22)   IV Piggyback 400   Total Intake(mL/kg) 1951.6 (32.7)   Urine (mL/kg/hr) 850   Blood 15   Total Output 865   Net +1086.6          LABORATORY DATA: Recent Labs    10/25/18 1550  WBC 6.9  HGB 12.1  HCT 38.3  PLT 271   Recent Labs    10/25/18 1550  NA 140  K 4.0  CL 104   CO2 27  BUN 14  CREATININE 0.67  GLUCOSE 96  CALCIUM 9.4   No results found for: INR, PROTIME  Examination:  General appearance: alert, cooperative and no distress Extremities: extremities normal, atraumatic, no cyanosis or edema  Wound Exam: clean, dry, intact   Drainage:  None: wound tissue dry  Motor Exam: Quadriceps and Hamstrings Intact  Sensory Exam: Superficial Peroneal, Deep Peroneal and Tibial normal   Assessment:    Day of Surgery  Procedure(s) (LRB): UNICOMPARTMENTAL LEFT KNEE (Left)  ADDITIONAL DIAGNOSIS:  Active Problems:   S/P left unicompartmental knee replacement     Plan: Physical Therapy  as ordered Weight Bearing as Tolerated (WBAT)  DVT Prophylaxis:  Aspirin  DISCHARGE PLAN: Home  Patient doing well, expected D/C tomorrow around lunch once cleared by PT      Patient's anticipated LOS is less than 2 midnights, meeting these requirements: - Lives within 1 hour of care - Has a competent adult at home to recover with post-op recover - NO history of  - Chronic pain requiring opiods  - Diabetes  - Coronary Artery Disease  - Heart failure  - Heart attack  - Stroke  - DVT/VTE  - Cardiac arrhythmia  - Respiratory Failure/COPD  - Renal failure  - Anemia  - Advanced Liver disease        Donia Ast 10/29/2018, 7:52 PM

## 2018-10-29 NOTE — Anesthesia Postprocedure Evaluation (Signed)
Anesthesia Post Note  Patient: Katelyn Cole  Procedure(s) Performed: UNICOMPARTMENTAL LEFT KNEE (Left )     Patient location during evaluation: PACU Anesthesia Type: General Level of consciousness: awake and alert Pain management: pain level controlled Vital Signs Assessment: post-procedure vital signs reviewed and stable Respiratory status: spontaneous breathing, nonlabored ventilation, respiratory function stable and patient connected to nasal cannula oxygen Cardiovascular status: blood pressure returned to baseline and stable Postop Assessment: no apparent nausea or vomiting Anesthetic complications: no    Last Vitals:  Vitals:   10/29/18 1234 10/29/18 1245  BP: (!) 170/82 (!) 177/81  Pulse: 66 66  Resp: (!) 8 (!) 9  Temp: 36.4 C   SpO2: 99% 100%    Last Pain:  Vitals:   10/29/18 1245  TempSrc:   PainSc: 0-No pain                 Nickalus Thornsberry COKER

## 2018-10-29 NOTE — Plan of Care (Signed)

## 2018-10-29 NOTE — H&P (Signed)
Katelyn Cole MRN:  [id number] DOB/SEX:  [birth year]/female  CHIEF COMPLAINT:  Painful left Knee  HISTORY: Patient is a 85 y.o. female presented with a history of pain in the left knee. Onset of symptoms was gradual starting a few months ago with gradually worsening course since that time. Patient has been treated conservatively with over-the-counter NSAIDs and activity modification. Patient currently rates pain in the knee at 10 out of 10 with activity. There is pain at night.  PAST MEDICAL HISTORY: Patient Active Problem List   Diagnosis Date Noted  . Dyspnea on exertion 06/23/2016  . Essential hypertension 06/23/2016  . Palpitations 06/23/2016  . Syncope and collapse 06/23/2016  . Anxiety 05/21/2015  . CAD (coronary artery disease) 05/21/2015  . Depression, major, in remission ([place]) 05/21/2015  . Hyperlipidemia 05/21/2015  . Hypertensive heart disease 05/21/2015  . Aneurysm of thoracic aorta ([place]) 01/03/2013   Past Medical History:  Diagnosis Date  . Aneurysm of thoracic aorta ([place]) 01/03/2013  . Anxiety 05/21/2015  . Arthritis   . Basal cell carcinoma    left leg  . CAD (coronary artery disease) 05/21/2015  . Complication of anesthesia    Difficult to awaken, spinal anesthesia cause extreme hypotension  . Depression, major, in remission ([place]) 05/21/2015  . Dyspnea on exertion 06/23/2016  . Essential hypertension 06/23/2016  . History of iron deficiency anemia   . Hyperlipidemia 05/21/2015  . Hypertensive heart disease 05/21/2015  . Palpitations 06/23/2016  . PONV (postoperative nausea and vomiting)   . Staph aureus infection    left leg after basal cell excision  . Syncope and collapse 06/23/2016   Past Surgical History:  Procedure Laterality Date  . ABDOMINAL HYSTERECTOMY    . BASAL CELL CARCINOMA EXCISION    . birthmark removal  Left    forearm  . BLADDER SUSPENSION    . COLONOSCOPY    . FRACTURE SURGERY Left 2007   Wrist  . NASAL SINUS SURGERY    . UPPER GI ENDOSCOPY        MEDICATIONS:   Medications Prior to Admission  Medication Sig Dispense Refill Last Dose  . aspirin EC 81 MG tablet Take 81 mg by mouth daily.      . calcium citrate-vitamin D (CITRACAL+D) 315-200 MG-UNIT tablet Take 1 tablet by mouth 2 (two) times daily.     . carvedilol (COREG) 6.25 MG tablet Take 6.25 mg by mouth 2 (two) times daily.    10/29/2018 at 0600  . chlorthalidone (HYGROTON) 25 MG tablet Take 12.5 mg by mouth daily.     . Cyanocobalamin (VITAMIN B-12) 500 MCG LOZG Take 500 mcg by mouth daily.      . diphenhydramine-acetaminophen (TYLENOL PM) 25-500 MG TABS tablet Take 1 tablet by mouth at bedtime.     Marland Kitchen losartan (COZAAR) 100 MG tablet Take 100 mg by mouth daily.    10/28/2018 at Unknown time  . Multiple Vitamin (MULTI-VITAMINS) TABS Take 1 tablet by mouth daily.     . Omega-3 1000 MG CAPS Take 1 g by mouth daily.     . pravastatin (PRAVACHOL) 40 MG tablet Take 40 mg by mouth at bedtime.    10/28/2018 at Unknown time  . sertraline (ZOLOFT) 25 MG tablet Take 25 mg by mouth daily.    10/29/2018 at 0600    ALLERGIES:   Allergies  Allergen Reactions  . Atorvastatin     Other reaction(s): Arthralgias (intolerance)  . Lisinopril Cough  . Rosuvastatin     Other  reaction(s): Arthralgias (intolerance)    REVIEW OF SYSTEMS:  A comprehensive review of systems was negative except for: Musculoskeletal: positive for arthralgias and bone pain   FAMILY HISTORY:   Family History  Problem Relation Age of Onset  . Aneurysm Mother   . Heart disease Mother   . Aneurysm Brother     SOCIAL HISTORY:   Social History   Tobacco Use  . Smoking status: Never Smoker  . Smokeless tobacco: Never Used  Substance Use Topics  . Alcohol use: Not Currently     EXAMINATION:  Vital signs in last 24 hours: Temp:  [98.4 F (36.9 C)] 98.4 F (36.9 C) (07/27 0805) Pulse Rate:  [62] 62 (07/27 0805) Resp:  [18] 18 (07/27 0805) BP: (136)/(71) 136/71 (07/27 0805) SpO2:  [98 %] 98 % (07/27  0805)  BP 136/71   Pulse 62   Temp 98.4 F (36.9 C) (Oral)   Resp 18   SpO2 98%   General Appearance:    Alert, cooperative, no distress, appears stated age  Head:    Normocephalic, without obvious abnormality, atraumatic  Eyes:    PERRL, conjunctiva/corneas clear, EOM's intact, fundi    benign, both eyes  Ears:    Normal TM's and external ear canals, both ears  Nose:   Nares normal, septum midline, mucosa normal, no drainage    or sinus tenderness  Throat:   Lips, mucosa, and tongue normal; teeth and gums normal  Neck:   Supple, symmetrical, trachea midline, no adenopathy;    thyroid:  no enlargement/tenderness/nodules; no carotid   bruit or JVD  Back:     Symmetric, no curvature, ROM normal, no CVA tenderness  Lungs:     Clear to auscultation bilaterally, respirations unlabored  Chest Wall:    No tenderness or deformity   Heart:    Regular rate and rhythm, S1 and S2 normal, no murmur, rub   or gallop  Breast Exam:    No tenderness, masses, or nipple abnormality  Abdomen:     Soft, non-tender, bowel sounds active all four quadrants,    no masses, no organomegaly  Genitalia:    Normal female without lesion, discharge or tenderness  Rectal:    Normal tone, no masses or tenderness;   guaiac negative stool  Extremities:   Extremities normal, atraumatic, no cyanosis or edema  Pulses:   2+ and symmetric all extremities  Skin:   Skin color, texture, turgor normal, no rashes or lesions  Lymph nodes:   Cervical, supraclavicular, and axillary nodes normal  Neurologic:   CNII-XII intact, normal strength, sensation and reflexes    throughout    Musculoskeletal:  ROM 0-120, Ligaments intact,  Imaging Review Plain radiographs demonstrate severe degenerative joint disease of the left knee. The overall alignment is neutral. The bone quality appears to be good for age and reported activity level.  Assessment/Plan: Primary osteoarthritis, left knee   The patient history, physical  examination and imaging studies are consistent with advanced degenerative joint disease of the left knee. The patient has failed conservative treatment.  The clearance notes were reviewed.  After discussion with the patient it was felt that Total Knee Replacement was indicated. The procedure,  risks, and benefits of total knee arthroplasty were presented and reviewed. The risks including but not limited to aseptic loosening, infection, blood clots, vascular injury, stiffness, patella tracking problems complications among others were discussed. The patient acknowledged the explanation, agreed to proceed with the plan.  Preoperative templating of the  joint replacement has been completed, documented, and submitted to the Operating Room personnel in order to optimize intra-operative equipment management.    Patient's anticipated LOS is less than 2 midnights, meeting these requirements: - Lives within 1 hour of care - Has a competent adult at home to recover with post-op recover - NO history of  - Chronic pain requiring opiods  - Diabetes  - Coronary Artery Disease  - Heart failure  - Heart attack  - Stroke  - DVT/VTE  - Cardiac arrhythmia  - Respiratory Failure/COPD  - Renal failure  - Anemia  - Advanced Liver disease       Donia Ast 10/29/2018, 9:06 AM

## 2018-10-29 NOTE — Transfer of Care (Signed)
Immediate Anesthesia Transfer of Care Note  Patient: Katelyn Cole  Procedure(s) Performed: UNICOMPARTMENTAL LEFT KNEE (Left )  Patient Location: PACU  Anesthesia Type:General  Level of Consciousness: awake, alert  and oriented  Airway & Oxygen Therapy: Patient Spontanous Breathing and Patient connected to face mask oxygen  Post-op Assessment: Report given to RN and Post -op Vital signs reviewed and stable  Post vital signs: Reviewed and stable  Last Vitals:  Vitals Value Taken Time  BP 170/82 10/29/18 1234  Temp    Pulse 67 10/29/18 1236  Resp 8 10/29/18 1236  SpO2 98 % 10/29/18 1236  Vitals shown include unvalidated device data.  Last Pain:  Vitals:   10/29/18 1031  TempSrc:   PainSc: 0-No pain      Patients Stated Pain Goal: 3 (61/84/85 9276)  Complications: No apparent anesthesia complications

## 2018-10-30 ENCOUNTER — Encounter (HOSPITAL_COMMUNITY): Payer: Self-pay | Admitting: Orthopedic Surgery

## 2018-10-30 DIAGNOSIS — Z96652 Presence of left artificial knee joint: Secondary | ICD-10-CM | POA: Diagnosis not present

## 2018-10-30 DIAGNOSIS — M1712 Unilateral primary osteoarthritis, left knee: Secondary | ICD-10-CM | POA: Diagnosis not present

## 2018-10-30 LAB — CBC
HCT: 35.6 % — ABNORMAL LOW (ref 36.0–46.0)
Hemoglobin: 11.2 g/dL — ABNORMAL LOW (ref 12.0–15.0)
MCH: 30.8 pg (ref 26.0–34.0)
MCHC: 31.5 g/dL (ref 30.0–36.0)
MCV: 97.8 fL (ref 80.0–100.0)
Platelets: 231 10*3/uL (ref 150–400)
RBC: 3.64 MIL/uL — ABNORMAL LOW (ref 3.87–5.11)
RDW: 12.2 % (ref 11.5–15.5)
WBC: 11 10*3/uL — ABNORMAL HIGH (ref 4.0–10.5)
nRBC: 0 % (ref 0.0–0.2)

## 2018-10-30 LAB — BASIC METABOLIC PANEL
Anion gap: 5 (ref 5–15)
BUN: 15 mg/dL (ref 8–23)
CO2: 28 mmol/L (ref 22–32)
Calcium: 8.9 mg/dL (ref 8.9–10.3)
Chloride: 103 mmol/L (ref 98–111)
Creatinine, Ser: 0.68 mg/dL (ref 0.44–1.00)
GFR calc Af Amer: >60 mL/min (ref >60)
GFR calc non Af Amer: >60 mL/min (ref >60)
Glucose, Bld: 137 mg/dL — ABNORMAL HIGH (ref 70–99)
Potassium: 4.3 mmol/L (ref 3.5–5.1)
Sodium: 136 mmol/L (ref 135–145)

## 2018-10-30 MED ORDER — OXYCODONE HCL 5 MG PO TABS: 5.0000 mg | ORAL_TABLET | Freq: Four times a day (QID) | ORAL | 0 refills | Status: DC | PRN

## 2018-10-30 MED ORDER — METHOCARBAMOL 500 MG PO TABS: 500.0000 mg | ORAL_TABLET | Freq: Four times a day (QID) | ORAL | 0 refills | Status: DC | PRN

## 2018-10-30 MED ORDER — ASPIRIN 325 MG PO TBEC: 325.0000 mg | DELAYED_RELEASE_TABLET | Freq: Two times a day (BID) | ORAL | 0 refills | Status: DC

## 2018-10-30 NOTE — Progress Notes (Signed)
Patient walked hall and wore bone foam for 1 hour.

## 2018-10-30 NOTE — Progress Notes (Signed)
   10/30/18 1200  PT Visit Information  Last PT Received On 10/30/18  Pt progressing well. Exercise focused  Session. Pt tol well. Ready for d/c from PT standpoint.  Assistance Needed +1  History of Present Illness s/p L UKA. PMH: CAD, HTN  Subjective Data  Patient Stated Goal get back to gardening  Restrictions  Weight Bearing Restrictions No  Other Position/Activity Restrictions WBAT  Pain Assessment  Pain Assessment 0-10  Pain Score 6  Pain Location left knee  Pain Descriptors / Indicators Sore;Discomfort  Pain Intervention(s) Limited activity within patient's tolerance;Monitored during session;Premedicated before session;Ice applied  Cognition  Arousal/Alertness Awake/alert  Behavior During Therapy WFL for tasks assessed/performed  Overall Cognitive Status Within Functional Limits for tasks assessed  Total Joint Exercises  Ankle Circles/Pumps AROM;10 reps;Both  Quad Sets 10 reps;AROM;Both  Heel Slides AROM;Left;10 reps  Hip ABduction/ADduction AROM;Left;10 reps  Straight Leg Raises AROM;Left;10 reps  Knee Flexion AROM;Left;10 reps  Goniometric ROM grossly 5 to 90 degrees  Long Arc Quad AROM;Left;10 reps  Short Arc Quad AROM;Left;10 reps  PT - End of Session  Equipment Utilized During Treatment Gait belt  Activity Tolerance Patient tolerated treatment well  Patient left in chair;with chair alarm set  Nurse Communication Other (comment) (DME)   PT - Assessment/Plan  PT Plan Current plan remains appropriate  PT Visit Diagnosis Difficulty in walking, not elsewhere classified (R26.2)  PT Frequency (ACUTE ONLY) 7X/week  Follow Up Recommendations Follow surgeon's recommendation for DC plan and follow-up therapies  AM-PAC PT "6 Clicks" Mobility Outcome Measure (Version 2)  Help needed turning from your back to your side while in a flat bed without using bedrails? 3  Help needed moving from lying on your back to sitting on the side of a flat bed without using bedrails? 3   Help needed moving to and from a bed to a chair (including a wheelchair)? 3  Help needed standing up from a chair using your arms (e.g., wheelchair or bedside chair)? 3  Help needed to walk in hospital room? 3  Help needed climbing 3-5 steps with a railing?  3  6 Click Score 18  Consider Recommendation of Discharge To: Home with [facility]  PT Goal Progression  Progress towards PT goals Progressing toward goals  Acute Rehab PT Goals  PT Goal Formulation With patient  Time For Goal Achievement 11/05/18  Potential to Achieve Goals Good  PT Time Calculation  PT Start Time (ACUTE ONLY) 1214  PT Stop Time (ACUTE ONLY) 1231  PT Time Calculation (min) (ACUTE ONLY) 17 min  PT General Charges  $$ ACUTE PT VISIT 1 Visit  PT Treatments  $Therapeutic Exercise 8-22 mins

## 2018-10-30 NOTE — Progress Notes (Signed)
Physical Therapy Treatment Patient Details Name: Katelyn Cole MRN: [id number] DOB: March 10, 1931 Today's Date: 10/30/2018    History of Present Illness s/p L UKA. PMH: CAD, HTN    PT Comments    Pt progressing well. Reviewed gait/stairs/transfer safety. Will see for a second session to review HEP  Follow Up Recommendations  Follow surgeon's recommendation for DC plan and follow-up therapies     Equipment Recommendations       Recommendations for Other Services       Precautions / Restrictions Restrictions Weight Bearing Restrictions: No Other Position/Activity Restrictions: WBAT    Mobility  Bed Mobility Overal bed mobility: Needs Assistance Bed Mobility: Supine to Sit     Supine to sit: Modified independent (Device/Increase time)        Transfers Overall transfer level: Needs assistance Equipment used: Rolling walker (2 wheeled) Transfers: Sit to/from Stand Sit to Stand: Supervision         General transfer comment: cues for hand placement and LLE position  Ambulation/Gait Ambulation/Gait assistance: Supervision Gait Distance (Feet): 260 Feet Assistive device: Rolling walker (2 wheeled) Gait Pattern/deviations: Step-through pattern;Decreased stride length     General Gait Details: cues for RW position,  step length, gait progression   Stairs Stairs: Yes Stairs assistance: Min guard Stair Management: One rail Left;Step to pattern;Sideways Number of Stairs: 3 General stair comments: cues for sequence and technique   Wheelchair Mobility    Modified Rankin (Stroke Patients Only)       Balance                                            Cognition Arousal/Alertness: Awake/alert Behavior During Therapy: WFL for tasks assessed/performed Overall Cognitive Status: Within Functional Limits for tasks assessed                                        Exercises      General Comments        Pertinent  Vitals/Pain Pain Score: 2  Pain Location: left knee Pain Descriptors / Indicators: Sore;Discomfort Pain Intervention(s): Limited activity within patient's tolerance;Monitored during session;Premedicated before session;Ice applied;Repositioned    Home Living                      Prior Function            PT Goals (current goals can now be found in the care plan section) Acute Rehab PT Goals Patient Stated Goal: get back to gardening PT Goal Formulation: With patient Time For Goal Achievement: 11/05/18 Potential to Achieve Goals: Good Progress towards PT goals: Progressing toward goals    Frequency    7X/week      PT Plan Current plan remains appropriate    Co-evaluation              AM-PAC PT "6 Clicks" Mobility   Outcome Measure  Help needed turning from your back to your side while in a flat bed without using bedrails?: A Little Help needed moving from lying on your back to sitting on the side of a flat bed without using bedrails?: A Little Help needed moving to and from a bed to a chair (including a wheelchair)?: A Little Help needed standing up from a chair using your arms (  e.g., wheelchair or bedside chair)?: A Little Help needed to walk in hospital room?: A Little Help needed climbing 3-5 steps with a railing? : A Little 6 Click Score: 18    End of Session Equipment Utilized During Treatment: Gait belt Activity Tolerance: Patient tolerated treatment well Patient left: in chair;with chair alarm set   PT Visit Diagnosis: Difficulty in walking, not elsewhere classified (R26.2)     Time: 3668-1594 PT Time Calculation (min) (ACUTE ONLY): 17 min  Charges:  $Gait Training: 8-22 mins                     Kenyon Ana, PT  Pager: [phone] Acute Rehab Dept [facility]): [phone]   10/30/2018    [place] 10/30/2018, 10:55 AM

## 2018-10-30 NOTE — TOC Transition Note (Addendum)
Transition of Care [facility]) - CM/SW Discharge Note   Patient Details  Name: Katelyn Cole MRN: [id number] Date of Birth: 07/24/1930  Transition of Care [facility]) CM/SW Contact:  Lia Hopping, [place] Phone Number: 10/30/2018, 9:43 AM   Clinical Narrative:    Patient has RW and 3 in 1 ordered through [facility]     Final next level of care: OP Rehab Barriers to Discharge: No Barriers Identified   Patient Goals and CMS Choice        Discharge Placement  Home                      Discharge Plan and Services                DME Arranged: 3-N-1(Has RW) DME Agency: [facility]  Date DME Agency Contacted: 10/30/18 Time DME Agency Contacted: 2:00pm Representative spoke with at DME Agency: [place] Determinants of Health ([place]) Interventions     Readmission Risk Interventions No flowsheet data found.

## 2018-11-05 DIAGNOSIS — R2689 Other abnormalities of gait and mobility: Secondary | ICD-10-CM | POA: Diagnosis not present

## 2018-11-05 DIAGNOSIS — Z96652 Presence of left artificial knee joint: Secondary | ICD-10-CM | POA: Diagnosis not present

## 2018-11-05 DIAGNOSIS — M25562 Pain in left knee: Secondary | ICD-10-CM | POA: Diagnosis not present

## 2018-11-05 DIAGNOSIS — M25462 Effusion, left knee: Secondary | ICD-10-CM | POA: Diagnosis not present

## 2018-11-05 DIAGNOSIS — M62552 Muscle wasting and atrophy, not elsewhere classified, left thigh: Secondary | ICD-10-CM | POA: Diagnosis not present

## 2018-11-06 DIAGNOSIS — M25462 Effusion, left knee: Secondary | ICD-10-CM | POA: Diagnosis not present

## 2018-11-06 DIAGNOSIS — M11262 Other chondrocalcinosis, left knee: Secondary | ICD-10-CM | POA: Diagnosis not present

## 2018-11-06 DIAGNOSIS — I739 Peripheral vascular disease, unspecified: Secondary | ICD-10-CM | POA: Diagnosis not present

## 2018-11-06 DIAGNOSIS — Z96652 Presence of left artificial knee joint: Secondary | ICD-10-CM | POA: Diagnosis not present

## 2018-11-06 NOTE — Discharge Summary (Signed)
SPORTS MEDICINE & JOINT REPLACEMENT   Katelyn Mulch, MD   Katelyn Shadow, PA-C Kately[address] Cole, [place], [place]  [address]                             [phone]  PATIENT ID: Katelyn Cole        MRN:  [id number]          DOB/AGE: 1930-11-06 / 85 y.o.    DISCHARGE SUMMARY  ADMISSION DATE:    10/29/2018 DISCHARGE DATE:   10/30/2018  ADMISSION DIAGNOSIS: LT. KNEE OSTEOARTHRITIS    DISCHARGE DIAGNOSIS:  LT. KNEE OSTEOARTHRITIS    ADDITIONAL DIAGNOSIS: Active Problems:   S/P left unicompartmental knee replacement  Past Medical History:  Diagnosis Date  . Aneurysm of thoracic aorta ([place]) 01/03/2013  . Anxiety 05/21/2015  . Arthritis   . Basal cell carcinoma    left leg  . CAD (coronary artery disease) 05/21/2015  . Complication of anesthesia    Difficult to awaken, spinal anesthesia cause extreme hypotension  . Depression, major, in remission ([place]) 05/21/2015  . Dyspnea on exertion 06/23/2016  . Essential hypertension 06/23/2016  . History of iron deficiency anemia   . Hyperlipidemia 05/21/2015  . Hypertensive heart disease 05/21/2015  . Palpitations 06/23/2016  . PONV (postoperative nausea and vomiting)   . Staph aureus infection    left leg after basal cell excision  . Syncope and collapse 06/23/2016    PROCEDURE: Procedure(s): UNICOMPARTMENTAL LEFT KNEE on 10/29/2018  CONSULTS:    HISTORY:  See H&P in chart  HOSPITAL COURSE:  Katelyn Cole is a 85 y.o. admitted on 10/29/2018 and found to have a diagnosis of LT. KNEE OSTEOARTHRITIS.  After appropriate laboratory studies were obtained  they were taken to the operating room on 10/29/2018 and underwent Procedure(s): UNICOMPARTMENTAL LEFT KNEE.   They were given perioperative antibiotics:  Anti-infectives (From admission, onward)   Start     Dose/Rate Route Frequency Ordered Stop   10/29/18 1600  ceFAZolin (ANCEF) IVPB 2g/100 mL premix     2 g 200 mL/hr over 30 Minutes Intravenous Every 6 hours 10/29/18 1434 10/29/18 2207    10/29/18 0745  ceFAZolin (ANCEF) IVPB 2g/100 mL premix     2 g 200 mL/hr over 30 Minutes Intravenous On call to O.R. 10/29/18 8938 10/29/18 1103    .  Patient given tranexamic acid IV or topical and exparel intra-operatively.  Tolerated the procedure well.    POD# 1: Vital signs were stable.  Patient denied Chest pain, shortness of breath, or calf pain.  Patient was started on Aspirin twice daily at 8am.  Consults to PT, OT, and care management were made.  The patient was weight bearing as tolerated.  CPM was placed on the operative leg 0-90 degrees for 6-8 hours a day. When out of the CPM, patient was placed in the foam block to achieve full extension. Incentive spirometry was taught.  Dressing was changed.       POD #2, Continued  PT for ambulation and exercise program.  IV saline locked.  O2 discontinued.    The remainder of the hospital course was dedicated to ambulation and strengthening.   The patient was discharged on 1 day post op in  Good condition.  Blood products given:none  DIAGNOSTIC STUDIES: Recent vital signs: No data found.     Recent laboratory studies: No results for input(s): WBC, HGB, HCT, PLT in the last 168  hours. No results for input(s): NA, K, CL, CO2, BUN, CREATININE, GLUCOSE, CALCIUM in the last 168 hours. No results found for: INR, PROTIME   Recent Radiographic Studies :  No results found.  DISCHARGE INSTRUCTIONS: Discharge Instructions    Call MD / Call 911   Complete by: As directed    If you experience chest pain or shortness of breath, CALL 911 and be transported to the hospital emergency room.  If you develope a fever above 101 F, pus (white drainage) or increased drainage or redness at the wound, or calf pain, call your surgeon's office.   Constipation Prevention   Complete by: As directed    Drink plenty of fluids.  Prune juice may be helpful.  You may use a stool softener, such as Colace (over the counter) 100 mg twice a day.  Use MiraLax  (over the counter) for constipation as needed.   Diet - low sodium heart healthy   Complete by: As directed    Discharge instructions   Complete by: As directed    INSTRUCTIONS AFTER JOINT REPLACEMENT   Remove items at home which could result in a fall. This includes throw rugs or furniture in walking pathways ICE to the affected joint every three hours while awake for 30 minutes at a time, for at least the first 3-5 days, and then as needed for pain and swelling.  Continue to use ice for pain and swelling. You may notice swelling that will progress down to the foot and ankle.  This is normal after surgery.  Elevate your leg when you are not up walking on it.   Continue to use the breathing machine you got in the hospital (incentive spirometer) which will help keep your temperature down.  It is common for your temperature to cycle up and down following surgery, especially at night when you are not up moving around and exerting yourself.  The breathing machine keeps your lungs expanded and your temperature down.   DIET:  As you were doing prior to hospitalization, we recommend a well-balanced diet.  DRESSING / WOUND CARE / SHOWERING  Keep the surgical dressing until follow up.  The dressing is water proof, so you can shower without any extra covering.  IF THE DRESSING FALLS OFF or the wound gets wet inside, change the dressing with sterile gauze.  Please use good hand washing techniques before changing the dressing.  Do not use any lotions or creams on the incision until instructed by your surgeon.    ACTIVITY  Increase activity slowly as tolerated, but follow the weight bearing instructions below.   No driving for 6 weeks or until further direction given by your physician.  You cannot drive while taking narcotics.  No lifting or carrying greater than 10 lbs. until further directed by your surgeon. Avoid periods of inactivity such as sitting longer than an hour when not asleep. This helps  prevent blood clots.  You may return to work once you are authorized by your doctor.     WEIGHT BEARING   Weight bearing as tolerated with assist device (walker, cane, etc) as directed, use it as long as suggested by your surgeon or therapist, typically at least 4-6 weeks.   EXERCISES  Results after joint replacement surgery are often greatly improved when you follow the exercise, range of motion and muscle strengthening exercises prescribed by your doctor. Safety measures are also important to protect the joint from further injury. Any time any of these exercises cause  you to have increased pain or swelling, decrease what you are doing until you are comfortable again and then slowly increase them. If you have problems or questions, call your caregiver or physical therapist for advice.   Rehabilitation is important following a joint replacement. After just a few days of immobilization, the muscles of the leg can become weakened and shrink (atrophy).  These exercises are designed to build up the tone and strength of the thigh and leg muscles and to improve motion. Often times heat used for twenty to thirty minutes before working out will loosen up your tissues and help with improving the range of motion but do not use heat for the first two weeks following surgery (sometimes heat can increase post-operative swelling).   These exercises can be done on a training (exercise) mat, on the floor, on a table or on a bed. Use whatever works the best and is most comfortable for you.    Use music or television while you are exercising so that the exercises are a pleasant break in your day. This will make your life better with the exercises acting as a break in your routine that you can look forward to.   Perform all exercises about fifteen times, three times per day or as directed.  You should exercise both the operative leg and the other leg as well.   Exercises include:   Quad Sets - Tighten up the muscle  on the front of the thigh (Quad) and hold for 5-10 seconds.   Straight Leg Raises - With your knee straight (if you were given a brace, keep it on), lift the leg to 60 degrees, hold for 3 seconds, and slowly lower the leg.  Perform this exercise against resistance later as your leg gets stronger.  Leg Slides: Lying on your back, slowly slide your foot toward your buttocks, bending your knee up off the floor (only go as far as is comfortable). Then slowly slide your foot back down until your leg is flat on the floor again.  Angel Wings: Lying on your back spread your legs to the side as far apart as you can without causing discomfort.  Hamstring Strength:  Lying on your back, push your heel against the floor with your leg straight by tightening up the muscles of your buttocks.  Repeat, but this time bend your knee to a comfortable angle, and push your heel against the floor.  You may put a pillow under the heel to make it more comfortable if necessary.   A rehabilitation program following joint replacement surgery can speed recovery and prevent re-injury in the future due to weakened muscles. Contact your doctor or a physical therapist for more information on knee rehabilitation.    CONSTIPATION  Constipation is defined medically as fewer than three stools per week and severe constipation as less than one stool per week.  Even if you have a regular bowel pattern at home, your normal regimen is likely to be disrupted due to multiple reasons following surgery.  Combination of anesthesia, postoperative narcotics, change in appetite and fluid intake all can affect your bowels.   YOU MUST use at least one of the following options; they are listed in order of increasing strength to get the job done.  They are all available over the counter, and you may need to use some, POSSIBLY even all of these options:    Drink plenty of fluids (prune juice may be helpful) and high fiber foods Colace 100  mg by mouth  twice a day  Senokot for constipation as directed and as needed Dulcolax (bisacodyl), take with full glass of water  Miralax (polyethylene glycol) once or twice a day as needed.  If you have tried all these things and are unable to have a bowel movement in the first 3-4 days after surgery call either your surgeon or your primary doctor.    If you experience loose stools or diarrhea, hold the medications until you stool forms back up.  If your symptoms do not get better within 1 week or if they get worse, check with your doctor.  If you experience "the worst abdominal pain ever" or develop nausea or vomiting, please contact the office immediately for further recommendations for treatment.   ITCHING:  If you experience itching with your medications, try taking only a single pain pill, or even half a pain pill at a time.  You can also use Benadryl over the counter for itching or also to help with sleep.   TED HOSE STOCKINGS:  Use stockings on both legs until for at least 2 weeks or as directed by physician office. They may be removed at night for sleeping.  MEDICATIONS:  See your medication summary on the "After Visit Summary" that nursing will review with you.  You may have some home medications which will be placed on hold until you complete the course of blood thinner medication.  It is important for you to complete the blood thinner medication as prescribed.  PRECAUTIONS:  If you experience chest pain or shortness of breath - call 911 immediately for transfer to the hospital emergency department.   If you develop a fever greater that 101 F, purulent drainage from wound, increased redness or drainage from wound, foul odor from the wound/dressing, or calf pain - CONTACT YOUR SURGEON.                                                   FOLLOW-UP APPOINTMENTS:  If you do not already have a post-op appointment, please call the office for an appointment to be seen by your surgeon.  Guidelines for how  soon to be seen are listed in your "After Visit Summary", but are typically between 1-4 weeks after surgery.  OTHER INSTRUCTIONS:   Knee Replacement:  Do not place pillow under knee, focus on keeping the knee straight while resting. CPM instructions: 0-90 degrees, 2 hours in the morning, 2 hours in the afternoon, and 2 hours in the evening. Place foam block, curve side up under heel at all times except when in CPM or when walking.  DO NOT modify, tear, cut, or change the foam block in any way.  MAKE SURE YOU:  Understand these instructions.  Get help right away if you are not doing well or get worse.    Thank you for letting us be a part of your medical care team.  It is a privilege we respect greatly.  We hope these instructions will help you stay on track for a fast and full recovery!   Increase activity slowly as tolerated   Complete by: As directed       DISCHARGE MEDICATIONS:   Allergies as of 10/30/2018      Reactions   Atorvastatin    Other reaction(s): Arthralgias (intolerance)   Lisinopril Cough   Rosuvastatin  Other reaction(s): Arthralgias (intolerance)      Medication List    STOP taking these medications   diphenhydramine-acetaminophen 25-500 MG Tabs tablet Commonly known as: TYLENOL PM     TAKE these medications   aspirin 325 MG EC tablet Take 1 tablet (325 mg total) by mouth 2 (two) times daily. What changed:   medication strength  how much to take  when to take this   calcium citrate-vitamin D 315-200 MG-UNIT tablet Commonly known as: CITRACAL+D Take 1 tablet by mouth 2 (two) times daily.   carvedilol 6.25 MG tablet Commonly known as: COREG Take 6.25 mg by mouth 2 (two) times daily.   chlorthalidone 25 MG tablet Commonly known as: HYGROTON Take 12.5 mg by mouth daily.   losartan 100 MG tablet Commonly known as: COZAAR Take 100 mg by mouth daily.   methocarbamol 500 MG tablet Commonly known as: ROBAXIN Take 1-2 tablets (500-1,000 mg  total) by mouth every 6 (six) hours as needed for muscle spasms.   Multi-Vitamins Tabs Take 1 tablet by mouth daily.   Omega-3 1000 MG Caps Take 1 g by mouth daily.   oxyCODONE 5 MG immediate release tablet Commonly known as: Oxy IR/ROXICODONE Take 1-2 tablets (5-10 mg total) by mouth every 6 (six) hours as needed for moderate pain (pain score 4-6).   pravastatin 40 MG tablet Commonly known as: PRAVACHOL Take 40 mg by mouth at bedtime.   sertraline 25 MG tablet Commonly known as: ZOLOFT Take 25 mg by mouth daily.   Vitamin B-12 500 MCG Lozg Take 500 mcg by mouth daily.       FOLLOW UP VISIT:    DISPOSITION: HOME VS. SNF  CONDITION:  Good   Donia Ast 11/06/2018, 12:36 PM

## 2018-11-09 DIAGNOSIS — M25462 Effusion, left knee: Secondary | ICD-10-CM | POA: Diagnosis not present

## 2018-11-09 DIAGNOSIS — M62552 Muscle wasting and atrophy, not elsewhere classified, left thigh: Secondary | ICD-10-CM | POA: Diagnosis not present

## 2018-11-09 DIAGNOSIS — Z96652 Presence of left artificial knee joint: Secondary | ICD-10-CM | POA: Diagnosis not present

## 2018-11-09 DIAGNOSIS — M25562 Pain in left knee: Secondary | ICD-10-CM | POA: Diagnosis not present

## 2018-11-09 DIAGNOSIS — R2689 Other abnormalities of gait and mobility: Secondary | ICD-10-CM | POA: Diagnosis not present

## 2018-11-12 DIAGNOSIS — R2689 Other abnormalities of gait and mobility: Secondary | ICD-10-CM | POA: Diagnosis not present

## 2018-11-12 DIAGNOSIS — Z96652 Presence of left artificial knee joint: Secondary | ICD-10-CM | POA: Diagnosis not present

## 2018-11-12 DIAGNOSIS — M25562 Pain in left knee: Secondary | ICD-10-CM | POA: Diagnosis not present

## 2018-11-12 DIAGNOSIS — M25462 Effusion, left knee: Secondary | ICD-10-CM | POA: Diagnosis not present

## 2018-11-12 DIAGNOSIS — M62552 Muscle wasting and atrophy, not elsewhere classified, left thigh: Secondary | ICD-10-CM | POA: Diagnosis not present

## 2018-11-16 DIAGNOSIS — M25462 Effusion, left knee: Secondary | ICD-10-CM | POA: Diagnosis not present

## 2018-11-16 DIAGNOSIS — R2689 Other abnormalities of gait and mobility: Secondary | ICD-10-CM | POA: Diagnosis not present

## 2018-11-16 DIAGNOSIS — M62552 Muscle wasting and atrophy, not elsewhere classified, left thigh: Secondary | ICD-10-CM | POA: Diagnosis not present

## 2018-11-16 DIAGNOSIS — Z96652 Presence of left artificial knee joint: Secondary | ICD-10-CM | POA: Diagnosis not present

## 2018-11-16 DIAGNOSIS — M25562 Pain in left knee: Secondary | ICD-10-CM | POA: Diagnosis not present

## 2018-11-21 DIAGNOSIS — M62552 Muscle wasting and atrophy, not elsewhere classified, left thigh: Secondary | ICD-10-CM | POA: Diagnosis not present

## 2018-11-21 DIAGNOSIS — Z96652 Presence of left artificial knee joint: Secondary | ICD-10-CM | POA: Diagnosis not present

## 2018-11-21 DIAGNOSIS — R2689 Other abnormalities of gait and mobility: Secondary | ICD-10-CM | POA: Diagnosis not present

## 2018-11-21 DIAGNOSIS — M25462 Effusion, left knee: Secondary | ICD-10-CM | POA: Diagnosis not present

## 2018-11-21 DIAGNOSIS — M25562 Pain in left knee: Secondary | ICD-10-CM | POA: Diagnosis not present

## 2018-11-23 DIAGNOSIS — Z96652 Presence of left artificial knee joint: Secondary | ICD-10-CM | POA: Diagnosis not present

## 2018-11-23 DIAGNOSIS — R2689 Other abnormalities of gait and mobility: Secondary | ICD-10-CM | POA: Diagnosis not present

## 2018-11-23 DIAGNOSIS — M25462 Effusion, left knee: Secondary | ICD-10-CM | POA: Diagnosis not present

## 2018-11-23 DIAGNOSIS — M25562 Pain in left knee: Secondary | ICD-10-CM | POA: Diagnosis not present

## 2018-11-23 DIAGNOSIS — M62552 Muscle wasting and atrophy, not elsewhere classified, left thigh: Secondary | ICD-10-CM | POA: Diagnosis not present

## 2018-11-26 DIAGNOSIS — M62552 Muscle wasting and atrophy, not elsewhere classified, left thigh: Secondary | ICD-10-CM | POA: Diagnosis not present

## 2018-11-26 DIAGNOSIS — R2689 Other abnormalities of gait and mobility: Secondary | ICD-10-CM | POA: Diagnosis not present

## 2018-11-26 DIAGNOSIS — Z96652 Presence of left artificial knee joint: Secondary | ICD-10-CM | POA: Diagnosis not present

## 2018-11-26 DIAGNOSIS — M25562 Pain in left knee: Secondary | ICD-10-CM | POA: Diagnosis not present

## 2018-11-26 DIAGNOSIS — M25462 Effusion, left knee: Secondary | ICD-10-CM | POA: Diagnosis not present

## 2018-11-30 DIAGNOSIS — M62552 Muscle wasting and atrophy, not elsewhere classified, left thigh: Secondary | ICD-10-CM | POA: Diagnosis not present

## 2018-11-30 DIAGNOSIS — M25562 Pain in left knee: Secondary | ICD-10-CM | POA: Diagnosis not present

## 2018-11-30 DIAGNOSIS — M25462 Effusion, left knee: Secondary | ICD-10-CM | POA: Diagnosis not present

## 2018-11-30 DIAGNOSIS — R2689 Other abnormalities of gait and mobility: Secondary | ICD-10-CM | POA: Diagnosis not present

## 2018-11-30 DIAGNOSIS — Z96652 Presence of left artificial knee joint: Secondary | ICD-10-CM | POA: Diagnosis not present

## 2018-12-03 DIAGNOSIS — R2689 Other abnormalities of gait and mobility: Secondary | ICD-10-CM | POA: Diagnosis not present

## 2018-12-03 DIAGNOSIS — M62552 Muscle wasting and atrophy, not elsewhere classified, left thigh: Secondary | ICD-10-CM | POA: Diagnosis not present

## 2018-12-03 DIAGNOSIS — Z96652 Presence of left artificial knee joint: Secondary | ICD-10-CM | POA: Diagnosis not present

## 2018-12-03 DIAGNOSIS — M25562 Pain in left knee: Secondary | ICD-10-CM | POA: Diagnosis not present

## 2018-12-03 DIAGNOSIS — M25462 Effusion, left knee: Secondary | ICD-10-CM | POA: Diagnosis not present

## 2018-12-07 DIAGNOSIS — M25562 Pain in left knee: Secondary | ICD-10-CM | POA: Diagnosis not present

## 2018-12-07 DIAGNOSIS — Z96652 Presence of left artificial knee joint: Secondary | ICD-10-CM | POA: Diagnosis not present

## 2018-12-07 DIAGNOSIS — M25462 Effusion, left knee: Secondary | ICD-10-CM | POA: Diagnosis not present

## 2018-12-07 DIAGNOSIS — R2689 Other abnormalities of gait and mobility: Secondary | ICD-10-CM | POA: Diagnosis not present

## 2018-12-07 DIAGNOSIS — M62552 Muscle wasting and atrophy, not elsewhere classified, left thigh: Secondary | ICD-10-CM | POA: Diagnosis not present

## 2019-01-26 DIAGNOSIS — Z1231 Encounter for screening mammogram for malignant neoplasm of breast: Secondary | ICD-10-CM | POA: Diagnosis not present

## 2019-01-28 DIAGNOSIS — Z0000 Encounter for general adult medical examination without abnormal findings: Secondary | ICD-10-CM | POA: Diagnosis not present

## 2019-01-28 DIAGNOSIS — Z23 Encounter for immunization: Secondary | ICD-10-CM | POA: Diagnosis not present

## 2019-04-25 DIAGNOSIS — M25561 Pain in right knee: Secondary | ICD-10-CM | POA: Diagnosis not present

## 2019-04-25 DIAGNOSIS — M25461 Effusion, right knee: Secondary | ICD-10-CM | POA: Diagnosis not present

## 2019-04-26 DIAGNOSIS — L570 Actinic keratosis: Secondary | ICD-10-CM | POA: Diagnosis not present

## 2019-04-26 DIAGNOSIS — L821 Other seborrheic keratosis: Secondary | ICD-10-CM | POA: Diagnosis not present

## 2019-04-26 DIAGNOSIS — C44719 Basal cell carcinoma of skin of left lower limb, including hip: Secondary | ICD-10-CM | POA: Diagnosis not present

## 2019-04-26 DIAGNOSIS — L578 Other skin changes due to chronic exposure to nonionizing radiation: Secondary | ICD-10-CM | POA: Diagnosis not present

## 2019-04-26 DIAGNOSIS — C44319 Basal cell carcinoma of skin of other parts of face: Secondary | ICD-10-CM | POA: Diagnosis not present

## 2019-05-07 DIAGNOSIS — C44319 Basal cell carcinoma of skin of other parts of face: Secondary | ICD-10-CM | POA: Diagnosis not present

## 2019-05-20 DIAGNOSIS — C44719 Basal cell carcinoma of skin of left lower limb, including hip: Secondary | ICD-10-CM | POA: Diagnosis not present

## 2019-07-29 DIAGNOSIS — I2510 Atherosclerotic heart disease of native coronary artery without angina pectoris: Secondary | ICD-10-CM | POA: Diagnosis not present

## 2019-07-29 DIAGNOSIS — I712 Thoracic aortic aneurysm, without rupture: Secondary | ICD-10-CM | POA: Diagnosis not present

## 2019-07-29 DIAGNOSIS — I119 Hypertensive heart disease without heart failure: Secondary | ICD-10-CM | POA: Diagnosis not present

## 2019-07-29 DIAGNOSIS — Z79899 Other long term (current) drug therapy: Secondary | ICD-10-CM | POA: Diagnosis not present

## 2019-07-29 DIAGNOSIS — G608 Other hereditary and idiopathic neuropathies: Secondary | ICD-10-CM | POA: Insufficient documentation

## 2019-07-29 DIAGNOSIS — F325 Major depressive disorder, single episode, in full remission: Secondary | ICD-10-CM | POA: Diagnosis not present

## 2019-07-29 DIAGNOSIS — M8949 Other hypertrophic osteoarthropathy, multiple sites: Secondary | ICD-10-CM | POA: Diagnosis not present

## 2019-08-19 DIAGNOSIS — C44719 Basal cell carcinoma of skin of left lower limb, including hip: Secondary | ICD-10-CM | POA: Diagnosis not present

## 2019-08-19 DIAGNOSIS — C44319 Basal cell carcinoma of skin of other parts of face: Secondary | ICD-10-CM | POA: Diagnosis not present

## 2019-09-27 DIAGNOSIS — F325 Major depressive disorder, single episode, in full remission: Secondary | ICD-10-CM | POA: Diagnosis not present

## 2019-09-27 DIAGNOSIS — Z79899 Other long term (current) drug therapy: Secondary | ICD-10-CM | POA: Diagnosis not present

## 2019-09-27 DIAGNOSIS — I119 Hypertensive heart disease without heart failure: Secondary | ICD-10-CM | POA: Diagnosis not present

## 2019-09-27 DIAGNOSIS — M8949 Other hypertrophic osteoarthropathy, multiple sites: Secondary | ICD-10-CM | POA: Diagnosis not present

## 2019-09-27 DIAGNOSIS — Z0000 Encounter for general adult medical examination without abnormal findings: Secondary | ICD-10-CM | POA: Diagnosis not present

## 2019-10-28 DIAGNOSIS — H6122 Impacted cerumen, left ear: Secondary | ICD-10-CM | POA: Diagnosis not present

## 2019-10-28 DIAGNOSIS — H938X2 Other specified disorders of left ear: Secondary | ICD-10-CM | POA: Diagnosis not present

## 2019-10-28 DIAGNOSIS — H9192 Unspecified hearing loss, left ear: Secondary | ICD-10-CM | POA: Diagnosis not present

## 2019-11-21 ENCOUNTER — Encounter: Payer: Self-pay | Admitting: Cardiology

## 2019-11-21 ENCOUNTER — Ambulatory Visit: Payer: PPO | Admitting: Cardiology

## 2019-11-21 ENCOUNTER — Other Ambulatory Visit: Payer: Self-pay

## 2019-11-21 VITALS — BP 108/58 | HR 71 | Ht 62.0 in | Wt 130.8 lb

## 2019-11-21 DIAGNOSIS — I712 Thoracic aortic aneurysm, without rupture, unspecified: Secondary | ICD-10-CM

## 2019-11-21 DIAGNOSIS — E782 Mixed hyperlipidemia: Secondary | ICD-10-CM | POA: Diagnosis not present

## 2019-11-21 DIAGNOSIS — I10 Essential (primary) hypertension: Secondary | ICD-10-CM | POA: Diagnosis not present

## 2019-11-21 DIAGNOSIS — R0609 Other forms of dyspnea: Secondary | ICD-10-CM

## 2019-11-21 DIAGNOSIS — R0600 Dyspnea, unspecified: Secondary | ICD-10-CM | POA: Diagnosis not present

## 2019-11-21 NOTE — Patient Instructions (Signed)
Medication Instructions:  Your physician recommends that you continue on your current medications as directed. Please refer to the Current Medication list given to you today.  *If you need a refill on your cardiac medications before your next appointment, please call your pharmacy*   Lab Work: Your physician recommends that you return for lab work today: bmp   If you have labs (blood work) drawn today and your tests are completely normal, you will receive your results only by: MyChart Message (if you have MyChart) OR A paper copy in the mail If you have any lab test that is abnormal or we need to change your treatment, we will call you to review the results.   Testing/Procedures: Your physician has requested that you have an abdominal aorta duplex. During this test, an ultrasound is used to evaluate the aorta. Allow 30 minutes for this exam. Do not eat after midnight the day before and avoid carbonated beverages  Non-Cardiac CT scanning, (CAT scanning), is a noninvasive, special x-ray that produces cross-sectional images of the body using x-rays and a computer. CT scans help physicians diagnose and treat medical conditions. For some CT exams, a contrast material is used to enhance visibility in the area of the body being studied. CT scans provide greater clarity and reveal more details than regular x-ray exams.    Follow-Up: At [facility], you and your health needs are our priority.  As part of our continuing mission to provide you with exceptional heart care, we have created designated Provider Care Teams.  These Care Teams include your primary Cardiologist (physician) and Advanced Practice Providers (APPs -  Physician Assistants and Nurse Practitioners) who all work together to provide you with the care you need, when you need it.  We recommend signing up for the patient portal called "MyChart".  Sign up information is provided on this After Visit Summary.  MyChart is used to connect  with patients for Virtual Visits (Telemedicine).  Patients are able to view lab/test results, encounter notes, upcoming appointments, etc.  Non-urgent messages can be sent to your provider as well.   To learn more about what you can do with MyChart, go to [email or web address].    Your next appointment:   1 year(s)  The format for your next appointment:   In Person  Provider:   Robert Krasowski, MD   Other Instructions    

## 2019-11-21 NOTE — Progress Notes (Signed)
Cardiology Office Note:    Date:  11/21/2019   ID:  Katelyn Cole, DOB [birth year], MRN [id number]  PCP:  Katelyn Berger, MD  Cardiologist:  Katelyn Campus, MD    Referring MD: Katelyn Berger, MD   No chief complaint on file. I am doing well  History of Present Illness:    Katelyn Cole is a 85 y.o. female with past medical history significant for ascending aortic aneurysm, last assessment on December 2019 with CT showing diameter 40 mm.  She also had ectasia involving descending thoracic aorta.  Also her diagnosis of essential hypertension, dyslipidemia.  Comes today to my for follow-up.  Overall doing great is still active and have no difficulty doing normal activity of daily living.  Denies have any chest pain tightness squeezing pressure burning chest.  Past Medical History:  Diagnosis Date  . Aneurysm of thoracic aorta ([place]) 01/03/2013  . Anxiety 05/21/2015  . Arthritis   . Basal cell carcinoma    left leg  . CAD (coronary artery disease) 05/21/2015  . Complication of anesthesia    Difficult to awaken, spinal anesthesia cause extreme hypotension  . Depression, major, in remission ([place]) 05/21/2015  . Dyspnea on exertion 06/23/2016  . Essential hypertension 06/23/2016  . History of iron deficiency anemia   . Hyperlipidemia 05/21/2015  . Hypertensive heart disease 05/21/2015  . Palpitations 06/23/2016  . PONV (postoperative nausea and vomiting)   . Staph aureus infection    left leg after basal cell excision  . Syncope and collapse 06/23/2016    Past Surgical History:  Procedure Laterality Date  . ABDOMINAL HYSTERECTOMY    . BASAL CELL CARCINOMA EXCISION    . birthmark removal  Left    forearm  . BLADDER SUSPENSION    . COLONOSCOPY    . FRACTURE SURGERY Left 2007   Wrist  . NASAL SINUS SURGERY    . PARTIAL KNEE ARTHROPLASTY Left 10/29/2018   Procedure: UNICOMPARTMENTAL LEFT KNEE;  Surgeon: Vickey Huger, MD;  Location: [facility];  Service: Orthopedics;  Laterality: Left;    . UPPER GI ENDOSCOPY      Current Medications: Current Meds  Medication Sig  . Alpha-Lipoic Acid 200 MG CAPS Take 200 mg by mouth in the morning and at bedtime.  Marland Kitchen aspirin EC 81 MG tablet Take 81 mg by mouth daily. Swallow whole.  . calcium citrate-vitamin D (CITRACAL+D) 315-200 MG-UNIT tablet Take 1 tablet by mouth 2 (two) times daily.  . carvedilol (COREG) 6.25 MG tablet Take 6.25 mg by mouth 2 (two) times daily.   . Cyanocobalamin (VITAMIN B-12) 500 MCG LOZG Take 500 mcg by mouth daily.   . hydrochlorothiazide (HYDRODIURIL) 25 MG tablet Take 25 mg by mouth daily.  Marland Kitchen losartan (COZAAR) 100 MG tablet Take 100 mg by mouth daily.   . Multiple Vitamin (MULTI-VITAMINS) TABS Take 1 tablet by mouth daily.  . Omega-3 1000 MG CAPS Take 1 g by mouth daily.  . pravastatin (PRAVACHOL) 40 MG tablet Take 40 mg by mouth at bedtime.   . sertraline (ZOLOFT) 25 MG tablet Take 25 mg by mouth daily.      Allergies:   Atorvastatin, Lisinopril, and Rosuvastatin   Social History   Socioeconomic History  . Marital status: Widowed    Spouse name: Not on file  . Number of children: Not on file  . Years of education: Not on file  . Highest education level: Not on file  Occupational History  . Not on  file  Tobacco Use  . Smoking status: Never Smoker  . Smokeless tobacco: Never Used  Vaping Use  . Vaping Use: Never used  Substance and Sexual Activity  . Alcohol use: Not Currently  . Drug use: Never  . Sexual activity: Not Currently    Birth control/protection: Surgical  Other Topics Concern  . Not on file  Social History Narrative  . Not on file   Social Determinants of Health   Financial Resource Strain:   . Difficulty of Paying Living Expenses: Not on file  Food Insecurity:   . Worried About Charity fundraiser in the Last Year: Not on file  . Ran Out of Food in the Last Year: Not on file  Transportation Needs:   . Lack of Transportation (Medical): Not on file  . Lack of Transportation  (Non-Medical): Not on file  Physical Activity:   . Days of Exercise per Week: Not on file  . Minutes of Exercise per Session: Not on file  Stress:   . Feeling of Stress : Not on file  Social Connections:   . Frequency of Communication with Friends and Family: Not on file  . Frequency of Social Gatherings with Friends and Family: Not on file  . Attends Religious Services: Not on file  . Active Member of Clubs or Organizations: Not on file  . Attends Archivist Meetings: Not on file  . Marital Status: Not on file     Family History: The patient's family history includes Aneurysm in her brother and mother; Heart disease in her mother. ROS:   Please see the history of present illness.    All 14 point review of systems negative except as described per history of present illness  EKGs/Labs/Other Studies Reviewed:      Recent Labs: No results found for requested labs within last 8760 hours.  Recent Lipid Panel No results found for: CHOL, TRIG, HDL, CHOLHDL, VLDL, LDLCALC, LDLDIRECT  Physical Exam:    VS:  BP (!) 108/58   Pulse 71   Ht 5\' 2"  (1.575 m)   Wt 130 lb 12.8 oz (59.3 kg)   SpO2 96%   BMI 23.92 kg/m     Wt Readings from Last 3 Encounters:  11/21/19 130 lb 12.8 oz (59.3 kg)  10/29/18 131 lb 9.5 oz (59.7 kg)  10/25/18 127 lb 3 oz (57.7 kg)     GEN:  Well nourished, well developed in no acute distress HEENT: Normal NECK: No JVD; No carotid bruits LYMPHATICS: No lymphadenopathy CARDIAC: RRR, no murmurs, no rubs, no gallops RESPIRATORY:  Clear to auscultation without rales, wheezing or rhonchi  ABDOMEN: Soft, non-tender, non-distended MUSCULOSKELETAL:  No edema; No deformity  SKIN: Warm and dry LOWER EXTREMITIES: no swelling NEUROLOGIC:  Alert and oriented x 3 PSYCHIATRIC:  Normal affect   ASSESSMENT:    1. Thoracic aortic aneurysm without rupture ([place])   2. Mixed hyperlipidemia   3. Dyspnea on exertion   4. Essential hypertension    PLAN:     In order of problems listed above:  1. Thoracic aortic aneurysm.  Time to recheck it.  I will schedule her to have a CT of her chest with contrast, I am doing this because there was also some ectasia of her descending thoracic aorta.  As a part of evaluation screening for aneurysm in different locations I will ask you to have abdominal ultrasounds. 2. Dyslipidemia: I did review care everywhere which showed me her last fasting lipid profile  from 3 months ago showing HDL of 44, LDL of 77 total cholesterol was 146.  Triglycerides 116.  She is on pravastatin which I will continue. 3. Essential hypertension: Her blood pressure actually is low today.  We will continue present management.   Medication Adjustments/Labs and Tests Ordered: Current medicines are reviewed at length with the patient today.  Concerns regarding medicines are outlined above.  No orders of the defined types were placed in this encounter.  Medication changes: No orders of the defined types were placed in this encounter.   Signed, Park Liter, MD, [facility] 11/21/2019 1:43 PM    [place]

## 2019-11-22 LAB — BASIC METABOLIC PANEL
BUN/Creatinine Ratio: 26 (ref 12–28)
BUN: 16 mg/dL (ref 8–27)
CO2: 29 mmol/L (ref 20–29)
Calcium: 9.4 mg/dL (ref 8.7–10.3)
Chloride: 94 mmol/L — ABNORMAL LOW (ref 96–106)
Creatinine, Ser: 0.61 mg/dL (ref 0.57–1.00)
GFR calc Af Amer: 94 mL/min/{1.73_m2} (ref >59)
GFR calc non Af Amer: 82 mL/min/{1.73_m2} (ref >59)
Glucose: 136 mg/dL — ABNORMAL HIGH (ref 65–99)
Potassium: 4.2 mmol/L (ref 3.5–5.2)
Sodium: 134 mmol/L (ref 134–144)

## 2019-11-28 DIAGNOSIS — I358 Other nonrheumatic aortic valve disorders: Secondary | ICD-10-CM | POA: Diagnosis not present

## 2019-11-28 DIAGNOSIS — I712 Thoracic aortic aneurysm, without rupture: Secondary | ICD-10-CM | POA: Diagnosis not present

## 2019-11-28 DIAGNOSIS — I2510 Atherosclerotic heart disease of native coronary artery without angina pectoris: Secondary | ICD-10-CM | POA: Diagnosis not present

## 2019-11-28 DIAGNOSIS — I700 Atherosclerosis of aorta: Secondary | ICD-10-CM | POA: Diagnosis not present

## 2019-11-29 DIAGNOSIS — J019 Acute sinusitis, unspecified: Secondary | ICD-10-CM | POA: Diagnosis not present

## 2019-12-16 ENCOUNTER — Other Ambulatory Visit: Payer: Self-pay

## 2019-12-16 ENCOUNTER — Ambulatory Visit (INDEPENDENT_AMBULATORY_CARE_PROVIDER_SITE_OTHER): Payer: PPO

## 2019-12-16 DIAGNOSIS — I712 Thoracic aortic aneurysm, without rupture, unspecified: Secondary | ICD-10-CM

## 2019-12-16 NOTE — Progress Notes (Signed)
Abdominal aortic duplex exam performed.  Jimmy Lynnae Ludemann RDCS, RVT 

## 2019-12-23 ENCOUNTER — Telehealth: Payer: Self-pay | Admitting: Cardiology

## 2019-12-23 NOTE — Telephone Encounter (Signed)
Spoke with the pt and she had a CT done for her AAA 11/28/19 at [facility] and they advised her they faxed it to Dr. Agustin Cree but she has not heard back from it.. I do not see the results in Epic.. will forward to [facility] for further review.

## 2019-12-23 NOTE — Telephone Encounter (Signed)
    Pt said she called [facility] for her CT result, and she was told they sent the result to Dr. Raliegh Ip on 08/26 at 8 pm

## 2019-12-30 NOTE — Telephone Encounter (Signed)
Katelyn Cole is calling back due to never receiving a callback in regards to her CT results. Please advise.

## 2019-12-31 NOTE — Telephone Encounter (Signed)
Called patient informed her of results.  

## 2019-12-31 NOTE — Telephone Encounter (Signed)
Thoracic aneurysm is minimally larger it measuring 4.1/3 0.8 cm right now, in 2019 was 3.9 with 3.4.  We do not do anything about it unless it reached the size of 5.5 cm.

## 2020-01-16 DIAGNOSIS — Z23 Encounter for immunization: Secondary | ICD-10-CM | POA: Diagnosis not present

## 2020-02-12 NOTE — Telephone Encounter (Signed)
I did review her CT, her aortic size increase but not to the point that we need to intervene.  We will continue present management.

## 2020-03-20 DIAGNOSIS — M50323 Other cervical disc degeneration at C6-C7 level: Secondary | ICD-10-CM | POA: Diagnosis not present

## 2020-03-20 DIAGNOSIS — M50321 Other cervical disc degeneration at C4-C5 level: Secondary | ICD-10-CM | POA: Diagnosis not present

## 2020-03-20 DIAGNOSIS — Z79899 Other long term (current) drug therapy: Secondary | ICD-10-CM | POA: Diagnosis not present

## 2020-03-20 DIAGNOSIS — I119 Hypertensive heart disease without heart failure: Secondary | ICD-10-CM | POA: Diagnosis not present

## 2020-03-20 DIAGNOSIS — F325 Major depressive disorder, single episode, in full remission: Secondary | ICD-10-CM | POA: Diagnosis not present

## 2020-03-20 DIAGNOSIS — M542 Cervicalgia: Secondary | ICD-10-CM | POA: Insufficient documentation

## 2020-03-20 DIAGNOSIS — M50322 Other cervical disc degeneration at C5-C6 level: Secondary | ICD-10-CM | POA: Diagnosis not present

## 2020-03-20 DIAGNOSIS — M8949 Other hypertrophic osteoarthropathy, multiple sites: Secondary | ICD-10-CM | POA: Diagnosis not present

## 2020-03-20 DIAGNOSIS — M4722 Other spondylosis with radiculopathy, cervical region: Secondary | ICD-10-CM | POA: Diagnosis not present

## 2020-03-20 DIAGNOSIS — M47812 Spondylosis without myelopathy or radiculopathy, cervical region: Secondary | ICD-10-CM | POA: Diagnosis not present

## 2020-03-22 DIAGNOSIS — M4722 Other spondylosis with radiculopathy, cervical region: Secondary | ICD-10-CM | POA: Insufficient documentation

## 2020-06-30 DIAGNOSIS — N12 Tubulo-interstitial nephritis, not specified as acute or chronic: Secondary | ICD-10-CM | POA: Diagnosis not present

## 2020-06-30 DIAGNOSIS — R110 Nausea: Secondary | ICD-10-CM | POA: Diagnosis not present

## 2020-07-20 DIAGNOSIS — M26622 Arthralgia of left temporomandibular joint: Secondary | ICD-10-CM | POA: Diagnosis not present

## 2020-07-20 DIAGNOSIS — N390 Urinary tract infection, site not specified: Secondary | ICD-10-CM | POA: Diagnosis not present

## 2020-07-20 DIAGNOSIS — B9620 Unspecified Escherichia coli [E. coli] as the cause of diseases classified elsewhere: Secondary | ICD-10-CM | POA: Diagnosis not present

## 2020-08-02 DIAGNOSIS — U071 COVID-19: Secondary | ICD-10-CM

## 2020-08-02 HISTORY — DX: COVID-19: U07.1

## 2020-08-21 DIAGNOSIS — Z8616 Personal history of COVID-19: Secondary | ICD-10-CM | POA: Insufficient documentation

## 2020-09-21 DIAGNOSIS — I119 Hypertensive heart disease without heart failure: Secondary | ICD-10-CM | POA: Diagnosis not present

## 2020-09-21 DIAGNOSIS — Z79899 Other long term (current) drug therapy: Secondary | ICD-10-CM | POA: Diagnosis not present

## 2020-09-21 DIAGNOSIS — Z87440 Personal history of urinary (tract) infections: Secondary | ICD-10-CM | POA: Diagnosis not present

## 2020-09-21 DIAGNOSIS — Z0000 Encounter for general adult medical examination without abnormal findings: Secondary | ICD-10-CM | POA: Diagnosis not present

## 2020-09-21 DIAGNOSIS — M4722 Other spondylosis with radiculopathy, cervical region: Secondary | ICD-10-CM | POA: Diagnosis not present

## 2020-09-21 DIAGNOSIS — F325 Major depressive disorder, single episode, in full remission: Secondary | ICD-10-CM | POA: Diagnosis not present

## 2020-09-21 DIAGNOSIS — Z8616 Personal history of COVID-19: Secondary | ICD-10-CM | POA: Diagnosis not present

## 2020-09-21 DIAGNOSIS — I712 Thoracic aortic aneurysm, without rupture: Secondary | ICD-10-CM | POA: Diagnosis not present

## 2020-10-01 DIAGNOSIS — L820 Inflamed seborrheic keratosis: Secondary | ICD-10-CM | POA: Diagnosis not present

## 2020-10-01 DIAGNOSIS — L821 Other seborrheic keratosis: Secondary | ICD-10-CM | POA: Diagnosis not present

## 2020-10-01 DIAGNOSIS — L578 Other skin changes due to chronic exposure to nonionizing radiation: Secondary | ICD-10-CM | POA: Diagnosis not present

## 2020-10-09 DIAGNOSIS — M8589 Other specified disorders of bone density and structure, multiple sites: Secondary | ICD-10-CM | POA: Diagnosis not present

## 2020-11-03 DIAGNOSIS — N904 Leukoplakia of vulva: Secondary | ICD-10-CM | POA: Diagnosis not present

## 2020-11-03 DIAGNOSIS — R300 Dysuria: Secondary | ICD-10-CM | POA: Diagnosis not present

## 2020-11-04 DIAGNOSIS — N390 Urinary tract infection, site not specified: Secondary | ICD-10-CM | POA: Diagnosis not present

## 2020-11-04 DIAGNOSIS — R630 Anorexia: Secondary | ICD-10-CM | POA: Diagnosis not present

## 2020-11-04 DIAGNOSIS — R631 Polydipsia: Secondary | ICD-10-CM | POA: Diagnosis not present

## 2020-11-04 DIAGNOSIS — R531 Weakness: Secondary | ICD-10-CM | POA: Diagnosis not present

## 2020-11-04 DIAGNOSIS — R110 Nausea: Secondary | ICD-10-CM | POA: Diagnosis not present

## 2020-11-09 ENCOUNTER — Telehealth: Payer: Self-pay | Admitting: Cardiology

## 2020-11-09 NOTE — Telephone Encounter (Signed)
Patient called and would like to transfer of care from Dr. Agustin Cree to Dr. Harriet Masson. Please confirm transfer

## 2020-11-09 NOTE — Telephone Encounter (Signed)
Spoke to the patient just now and I let her know that Dr. Harriet Masson is moving to [facility] in October. She states that she does not want to switch then. I got her switched to see Dr. Agustin Cree sooner.    Encouraged patient to call back with any questions or concerns.

## 2020-11-10 DIAGNOSIS — A4901 Methicillin susceptible Staphylococcus aureus infection, unspecified site: Secondary | ICD-10-CM | POA: Insufficient documentation

## 2020-11-10 DIAGNOSIS — T8859XA Other complications of anesthesia, initial encounter: Secondary | ICD-10-CM | POA: Insufficient documentation

## 2020-11-10 DIAGNOSIS — R112 Nausea with vomiting, unspecified: Secondary | ICD-10-CM | POA: Insufficient documentation

## 2020-11-10 DIAGNOSIS — Z862 Personal history of diseases of the blood and blood-forming organs and certain disorders involving the immune mechanism: Secondary | ICD-10-CM | POA: Insufficient documentation

## 2020-11-10 DIAGNOSIS — M1990 Unspecified osteoarthritis, unspecified site: Secondary | ICD-10-CM | POA: Insufficient documentation

## 2020-11-10 DIAGNOSIS — C4491 Basal cell carcinoma of skin, unspecified: Secondary | ICD-10-CM | POA: Insufficient documentation

## 2020-11-11 ENCOUNTER — Encounter: Payer: Self-pay | Admitting: Cardiology

## 2020-11-11 ENCOUNTER — Ambulatory Visit: Payer: PPO | Admitting: Cardiology

## 2020-11-11 ENCOUNTER — Other Ambulatory Visit: Payer: Self-pay

## 2020-11-11 ENCOUNTER — Other Ambulatory Visit (HOSPITAL_COMMUNITY): Payer: Self-pay | Admitting: Emergency Medicine

## 2020-11-11 VITALS — BP 136/70 | HR 86 | Ht 61.5 in | Wt 127.0 lb

## 2020-11-11 DIAGNOSIS — I10 Essential (primary) hypertension: Secondary | ICD-10-CM

## 2020-11-11 DIAGNOSIS — R0600 Dyspnea, unspecified: Secondary | ICD-10-CM | POA: Diagnosis not present

## 2020-11-11 DIAGNOSIS — R0789 Other chest pain: Secondary | ICD-10-CM

## 2020-11-11 DIAGNOSIS — R002 Palpitations: Secondary | ICD-10-CM | POA: Diagnosis not present

## 2020-11-11 DIAGNOSIS — I712 Thoracic aortic aneurysm, without rupture, unspecified: Secondary | ICD-10-CM

## 2020-11-11 DIAGNOSIS — I719 Aortic aneurysm of unspecified site, without rupture: Secondary | ICD-10-CM

## 2020-11-11 DIAGNOSIS — R0609 Other forms of dyspnea: Secondary | ICD-10-CM

## 2020-11-11 MED ORDER — METOPROLOL TARTRATE 50 MG PO TABS: ORAL_TABLET | ORAL | 0 refills | Status: DC

## 2020-11-11 NOTE — Progress Notes (Signed)
Cardiology Office Note:    Date:  11/11/2020   ID:  Katelyn Cole, DOB [birth year], MRN [id number]  PCP:  Maris Berger, MD  Cardiologist:  Jenne Campus, MD    Referring MD: Maris Berger, MD   Chief Complaint  Patient presents with   Shortness of Breath   chest pain    With heaviness    History of Present Illness:    Katelyn Cole is a 87 y.o. female with past medical history significant for ascending aortic aneurysm measuring 40 mm, also ectasia involving descending thoracic aortic, essential hypertension, dyslipidemia.  In May she suffered from COVID-19 infection she did have a rough time recovering.  She was fully vaccinated.  Now she complained of having fatigue tiredness shortness of breath as well as some chest pain which happens sometimes with exertion some characteristic worrisome about activation of coronary artery disease.  Past Medical History:  Diagnosis Date   Aneurysm of thoracic aorta ([place]) 01/03/2013   Anxiety 05/21/2015   Arthritis    Basal cell carcinoma    left leg   CAD (coronary artery disease) [id number]   Complication of anesthesia    Difficult to awaken, spinal anesthesia cause extreme hypotension   COVID 08/2020   Depression, major, in remission ([place]) 05/21/2015   Dyspnea on exertion 06/23/2016   Essential hypertension 06/23/2016   History of iron deficiency anemia    Hyperlipidemia 05/21/2015   Hypertensive heart disease 05/21/2015   Palpitations 06/23/2016   PONV (postoperative nausea and vomiting)    Staph aureus infection    left leg after basal cell excision   Syncope and collapse 06/23/2016    Past Surgical History:  Procedure Laterality Date   ABDOMINAL HYSTERECTOMY     BASAL CELL CARCINOMA EXCISION     birthmark removal  Left    forearm   BLADDER SUSPENSION     COLONOSCOPY     FRACTURE SURGERY Left 2007   Wrist   NASAL SINUS SURGERY     PARTIAL KNEE ARTHROPLASTY Left 10/29/2018   Procedure: UNICOMPARTMENTAL LEFT KNEE;   Surgeon: Vickey Huger, MD;  Location: [facility];  Service: Orthopedics;  Laterality: Left;   UPPER GI ENDOSCOPY      Current Medications: Current Meds  Medication Sig   Alpha-Lipoic Acid 200 MG CAPS Take 200 mg by mouth in the morning and at bedtime.   aspirin EC 81 MG tablet Take 81 mg by mouth daily. Swallow whole.   calcium citrate-vitamin D (CITRACAL+D) 315-200 MG-UNIT tablet Take 1 tablet by mouth 2 (two) times daily.   carvedilol (COREG) 6.25 MG tablet Take 6.25 mg by mouth 2 (two) times daily.    Cyanocobalamin (VITAMIN B-12) 500 MCG LOZG Take 500 mcg by mouth daily.    hydrochlorothiazide (HYDRODIURIL) 25 MG tablet Take 25 mg by mouth daily.   losartan (COZAAR) 100 MG tablet Take 100 mg by mouth daily.    metoprolol tartrate (LOPRESSOR) 50 MG tablet Take 2 hours before CT scan   Multiple Vitamin (MULTI-VITAMINS) TABS Take 1 tablet by mouth daily. Unknown strenght   Omega-3 1000 MG CAPS Take 1 g by mouth daily.   pravastatin (PRAVACHOL) 40 MG tablet Take 40 mg by mouth at bedtime.    sertraline (ZOLOFT) 25 MG tablet Take 25 mg by mouth daily.      Allergies:   Atorvastatin, Lisinopril, and Rosuvastatin   Social History   Socioeconomic History   Marital status: Widowed    Spouse name: Not on  file   Number of children: Not on file   Years of education: Not on file   Highest education level: Not on file  Occupational History   Not on file  Tobacco Use   Smoking status: Never   Smokeless tobacco: Never  Vaping Use   Vaping Use: Never used  Substance and Sexual Activity   Alcohol use: Not Currently   Drug use: Never   Sexual activity: Not Currently    Birth control/protection: Surgical  Other Topics Concern   Not on file  Social History Narrative   Not on file   Social Determinants of Health   Financial Resource Strain: Not on file  Food Insecurity: Not on file  Transportation Needs: Not on file  Physical Activity: Not on file  Stress: Not on file  Social  Connections: Not on file     Family History: The patient's family history includes Aneurysm in her brother and mother; Heart disease in her mother. ROS:   Please see the history of present illness.    All 14 point review of systems negative except as described per history of present illness  EKGs/Labs/Other Studies Reviewed:      Recent Labs: 11/21/2019: BUN 16; Creatinine, Ser 0.61; Potassium 4.2; Sodium 134  Recent Lipid Panel No results found for: CHOL, TRIG, HDL, CHOLHDL, VLDL, LDLCALC, LDLDIRECT  Physical Exam:    VS:  BP 136/70 (BP Location: Right Arm, Patient Position: Sitting)   Pulse 86   Ht 5' 1.5" (1.562 m)   Wt 127 lb (57.6 kg)   SpO2 94%   BMI 23.61 kg/m     Wt Readings from Last 3 Encounters:  11/11/20 127 lb (57.6 kg)  11/21/19 130 lb 12.8 oz (59.3 kg)  10/29/18 131 lb 9.5 oz (59.7 kg)     GEN:  Well nourished, well developed in no acute distress HEENT: Normal NECK: No JVD; No carotid bruits LYMPHATICS: No lymphadenopathy CARDIAC: RRR, no murmurs, no rubs, no gallops RESPIRATORY:  Clear to auscultation without rales, wheezing or rhonchi  ABDOMEN: Soft, non-tender, non-distended MUSCULOSKELETAL:  No edema; No deformity  SKIN: Warm and dry LOWER EXTREMITIES: no swelling NEUROLOGIC:  Alert and oriented x 3 PSYCHIATRIC:  Normal affect   ASSESSMENT:    1. Other chest pain   2. Aortic aneurysm without rupture, unspecified portion of aorta ([place])   3. Atypical chest pain   4. Thoracic aortic aneurysm without rupture ([place])   5. Essential hypertension   6. Palpitations   7. Dyspnea on exertion    PLAN:    In order of problems listed above:  Atypical chest pain.  I will ask her to have coronary CT angio which allowed [place] to look at her coronary arteries.  We will expand the field of scanning so we can look at her aorta as well.  On top of that that will allow [place] to look at lung parenchyma.  I think the multiple benefits of this test will do it.  In the  meantime we will continue present medications.  That include Coreg as well as aspirin and pravastatin. Dyslipidemia she is taking pravastatin 40 mg daily which I will continue. Essential hypertension blood pressure seems to be well controlled continue present management. Thoracic aortic aneurysm.  She will be scheduled to have coronary CT angio   Medication Adjustments/Labs and Tests Ordered: Current medicines are reviewed at length with the patient today.  Concerns regarding medicines are outlined above.  Orders Placed This Encounter  Procedures  CT CORONARY MORPH W/CTA COR W/SCORE W/CA W/CM &/OR WO/CM   Basic metabolic panel   EKG [id number]   Medication changes:  Meds ordered this encounter  Medications   metoprolol tartrate (LOPRESSOR) 50 MG tablet    Sig: Take 2 hours before CT scan    Dispense:  1 tablet    Refill:  0    Signed, Park Liter, MD, [facility] 11/11/2020 11:55 AM    [place]

## 2020-11-11 NOTE — Patient Instructions (Addendum)
Medication Instructions:  Your physician recommends that you continue on your current medications as directed. Please refer to the Current Medication list given to you today.  *If you need a refill on your cardiac medications before your next appointment, please call your pharmacy*   Lab Work: Your physician recommends that you return for lab work in:  3-7 days before CT scan: BMET If you have labs (blood work) drawn today and your tests are completely normal, you will receive your results only by: [place] (if you have MyChart) OR A paper copy in the mail If you have any lab test that is abnormal or we need to change your treatment, we will call you to review the results.   Testing/Procedures:   Your cardiac CT will be scheduled at one of the below locations:   [facility] [address] [place], [place] [address] [phone]   If scheduled at [facility], please arrive at the [facility] main entrance (entrance A) of [facility] 30 minutes prior to test start time. Proceed to the [facility] Radiology Department (first floor) to check-in and test prep.   Please follow these instructions carefully (unless otherwise directed):   On the Night Before the Test: Be sure to Drink plenty of water. Do not consume any caffeinated/decaffeinated beverages or chocolate 12 hours prior to your test. Do not take any antihistamines 12 hours prior to your test.  On the Day of the Test: Drink plenty of water until 1 hour prior to the test. Do not eat any food 4 hours prior to the test. You may take your regular medications prior to the test.  Take metoprolol (Lopressor) 50 mg two hours prior to test. HOLD Hydrochlorothiazide morning of the test. FEMALES- please wear underwire-free bra if available, avoid dresses & tight clothing      After the Test: Drink plenty of water. After receiving IV contrast, you may experience a mild flushed feeling. This  is normal. On occasion, you may experience a mild rash up to 24 hours after the test. This is not dangerous. If this occurs, you can take Benadryl 25 mg and increase your fluid intake. If you experience trouble breathing, this can be serious. If it is severe call 911 IMMEDIATELY. If it is mild, please call our office. If you take any of these medications: Glipizide/Metformin, Avandament, Glucavance, please do not take 48 hours after completing test unless otherwise instructed.  Please allow 2-4 weeks for scheduling of routine cardiac CTs. Some insurance companies require a pre-authorization which may delay scheduling of this test.   For non-scheduling related questions, please contact the cardiac imaging nurse navigator should you have any questions/concerns: Marchia Bond, Cardiac Imaging Nurse Navigator Gordy Clement, Cardiac Imaging Nurse Navigator [facility] Direct Office Dial: [phone]   For scheduling needs, including cancellations and rescheduling, please call [place], [phone].    Follow-Up: At [facility], you and your health needs are our priority.  As part of our continuing mission to provide you with exceptional heart care, we have created designated Provider Care Teams.  These Care Teams include your primary Cardiologist (physician) and Advanced Practice Providers (APPs -  Physician Assistants and Nurse Practitioners) who all work together to provide you with the care you need, when you need it.  We recommend signing up for the patient portal called "MyChart".  Sign up information is provided on this After Visit Summary.  MyChart is used to connect with  patients for Virtual Visits (Telemedicine).  Patients are able to view lab/test results, encounter notes, upcoming appointments, etc.  Non-urgent messages can be sent to your provider as well.   To learn more about what you can do with MyChart, go to [email or web address].    Your next  appointment:   3 month(s)  The format for your next appointment:   In Person  Provider:   Jenne Campus, MD   Other Instructions

## 2020-11-12 ENCOUNTER — Telehealth (HOSPITAL_COMMUNITY): Payer: Self-pay | Admitting: Emergency Medicine

## 2020-11-12 NOTE — Telephone Encounter (Signed)
Reaching out to patient to offer assistance regarding upcoming cardiac imaging study; pt verbalizes understanding of appt date/time, parking situation and where to check in, pre-test NPO status and medications ordered, and verified current allergies; name and call back number provided for further questions should they arise Marchia Bond RN [facility] [phone] office [phone] cell  Denies claustro Denies iv issues '50mg'$  metoprolol

## 2020-11-13 ENCOUNTER — Ambulatory Visit (HOSPITAL_COMMUNITY)
Admission: RE | Admit: 2020-11-13 | Discharge: 2020-11-13 | Disposition: A | Discharge status: Home / Self Care | Payer: PPO | Source: Ambulatory Visit | Attending: Cardiology | Admitting: Cardiology

## 2020-11-13 ENCOUNTER — Other Ambulatory Visit: Payer: Self-pay

## 2020-11-13 ENCOUNTER — Encounter (HOSPITAL_COMMUNITY): Payer: Self-pay

## 2020-11-13 DIAGNOSIS — I712 Thoracic aortic aneurysm, without rupture, unspecified: Secondary | ICD-10-CM

## 2020-11-13 DIAGNOSIS — I700 Atherosclerosis of aorta: Secondary | ICD-10-CM | POA: Diagnosis not present

## 2020-11-13 DIAGNOSIS — R0789 Other chest pain: Secondary | ICD-10-CM

## 2020-11-13 DIAGNOSIS — I719 Aortic aneurysm of unspecified site, without rupture: Secondary | ICD-10-CM | POA: Diagnosis not present

## 2020-11-13 DIAGNOSIS — I2510 Atherosclerotic heart disease of native coronary artery without angina pectoris: Secondary | ICD-10-CM | POA: Diagnosis not present

## 2020-11-13 MED ORDER — METOPROLOL TARTRATE 5 MG/5ML IV SOLN: 5.0000 mg | Freq: Once | INTRAVENOUS | Status: DC

## 2020-11-13 MED ORDER — IOHEXOL 350 MG/ML SOLN
100.0000 mL | Freq: Once | INTRAVENOUS | Status: AC | PRN
  Administered 2020-11-13: 100 mL via INTRAVENOUS

## 2020-11-13 MED ORDER — NITROGLYCERIN 0.4 MG SL SUBL
0.8000 mg | SUBLINGUAL_TABLET | Freq: Once | SUBLINGUAL | Status: AC
  Administered 2020-11-13: 0.8 mg via SUBLINGUAL

## 2020-11-13 MED ORDER — NITROGLYCERIN 0.4 MG SL SUBL
SUBLINGUAL_TABLET | SUBLINGUAL | Status: AC
  Filled 2020-11-13: qty 2

## 2020-11-13 MED ORDER — METOPROLOL TARTRATE 5 MG/5ML IV SOLN
INTRAVENOUS | Status: AC
  Filled 2020-11-13: qty 5

## 2020-11-15 DIAGNOSIS — I2510 Atherosclerotic heart disease of native coronary artery without angina pectoris: Secondary | ICD-10-CM | POA: Diagnosis not present

## 2020-11-20 DIAGNOSIS — R0789 Other chest pain: Secondary | ICD-10-CM | POA: Diagnosis not present

## 2020-11-20 DIAGNOSIS — J40 Bronchitis, not specified as acute or chronic: Secondary | ICD-10-CM | POA: Insufficient documentation

## 2020-11-20 DIAGNOSIS — E871 Hypo-osmolality and hyponatremia: Secondary | ICD-10-CM | POA: Insufficient documentation

## 2020-11-20 DIAGNOSIS — U099 Post covid-19 condition, unspecified: Secondary | ICD-10-CM | POA: Insufficient documentation

## 2020-11-20 DIAGNOSIS — R5382 Chronic fatigue, unspecified: Secondary | ICD-10-CM | POA: Diagnosis not present

## 2020-11-20 DIAGNOSIS — G8929 Other chronic pain: Secondary | ICD-10-CM | POA: Diagnosis not present

## 2020-11-20 DIAGNOSIS — R519 Headache, unspecified: Secondary | ICD-10-CM | POA: Diagnosis not present

## 2020-11-20 DIAGNOSIS — R0600 Dyspnea, unspecified: Secondary | ICD-10-CM | POA: Diagnosis not present

## 2020-11-20 DIAGNOSIS — U071 COVID-19: Secondary | ICD-10-CM | POA: Diagnosis not present

## 2020-12-01 ENCOUNTER — Other Ambulatory Visit (HOSPITAL_COMMUNITY): Payer: Self-pay | Admitting: *Deleted

## 2020-12-01 ENCOUNTER — Ambulatory Visit (HOSPITAL_COMMUNITY)
Admission: RE | Admit: 2020-12-01 | Discharge: 2020-12-01 | Disposition: A | Discharge status: Home / Self Care | Payer: PPO | Source: Ambulatory Visit | Attending: Cardiology | Admitting: Cardiology

## 2020-12-01 DIAGNOSIS — R931 Abnormal findings on diagnostic imaging of heart and coronary circulation: Secondary | ICD-10-CM | POA: Insufficient documentation

## 2020-12-09 DIAGNOSIS — M542 Cervicalgia: Secondary | ICD-10-CM | POA: Diagnosis not present

## 2020-12-18 DIAGNOSIS — M542 Cervicalgia: Secondary | ICD-10-CM | POA: Diagnosis not present

## 2020-12-23 DIAGNOSIS — M542 Cervicalgia: Secondary | ICD-10-CM | POA: Diagnosis not present

## 2020-12-23 DIAGNOSIS — Z23 Encounter for immunization: Secondary | ICD-10-CM | POA: Diagnosis not present

## 2020-12-23 DIAGNOSIS — R5382 Chronic fatigue, unspecified: Secondary | ICD-10-CM | POA: Diagnosis not present

## 2020-12-23 DIAGNOSIS — U099 Post covid-19 condition, unspecified: Secondary | ICD-10-CM | POA: Diagnosis not present

## 2020-12-23 DIAGNOSIS — I119 Hypertensive heart disease without heart failure: Secondary | ICD-10-CM | POA: Diagnosis not present

## 2020-12-23 DIAGNOSIS — E871 Hypo-osmolality and hyponatremia: Secondary | ICD-10-CM | POA: Diagnosis not present

## 2020-12-24 DIAGNOSIS — M542 Cervicalgia: Secondary | ICD-10-CM | POA: Diagnosis not present

## 2020-12-31 DIAGNOSIS — M542 Cervicalgia: Secondary | ICD-10-CM | POA: Diagnosis not present

## 2021-01-06 DIAGNOSIS — M542 Cervicalgia: Secondary | ICD-10-CM | POA: Diagnosis not present

## 2021-01-13 DIAGNOSIS — M542 Cervicalgia: Secondary | ICD-10-CM | POA: Diagnosis not present

## 2021-01-18 ENCOUNTER — Telehealth: Payer: Self-pay | Admitting: Cardiology

## 2021-01-18 NOTE — Telephone Encounter (Signed)
Patient states she received a bill for 2 CT's. She says she went to the CT appointment 8/12, but never came for 8/31 and was not aware of one being scheduled that day. She says she talked to the hospital and they told her to contact the office about whether it was scheduled.

## 2021-01-18 NOTE — Telephone Encounter (Signed)
Spoke to patient informed her the "second ct" looks like it was just the FFR portion though. No further questions at this time.

## 2021-01-21 DIAGNOSIS — M542 Cervicalgia: Secondary | ICD-10-CM | POA: Diagnosis not present

## 2021-01-27 DIAGNOSIS — M542 Cervicalgia: Secondary | ICD-10-CM | POA: Diagnosis not present

## 2021-02-04 ENCOUNTER — Ambulatory Visit: Payer: PPO | Admitting: Cardiology

## 2021-02-04 DIAGNOSIS — M542 Cervicalgia: Secondary | ICD-10-CM | POA: Diagnosis not present

## 2021-02-19 ENCOUNTER — Ambulatory Visit: Payer: PPO | Admitting: Cardiology

## 2021-03-23 DIAGNOSIS — E871 Hypo-osmolality and hyponatremia: Secondary | ICD-10-CM | POA: Diagnosis not present

## 2021-03-23 DIAGNOSIS — M542 Cervicalgia: Secondary | ICD-10-CM | POA: Diagnosis not present

## 2021-03-23 DIAGNOSIS — Z79899 Other long term (current) drug therapy: Secondary | ICD-10-CM | POA: Diagnosis not present

## 2021-03-23 DIAGNOSIS — I119 Hypertensive heart disease without heart failure: Secondary | ICD-10-CM | POA: Diagnosis not present

## 2021-03-23 DIAGNOSIS — M4722 Other spondylosis with radiculopathy, cervical region: Secondary | ICD-10-CM | POA: Diagnosis not present

## 2021-05-12 DIAGNOSIS — T783XXA Angioneurotic edema, initial encounter: Secondary | ICD-10-CM | POA: Insufficient documentation

## 2021-05-31 DIAGNOSIS — T50905A Adverse effect of unspecified drugs, medicaments and biological substances, initial encounter: Secondary | ICD-10-CM | POA: Insufficient documentation

## 2022-03-15 ENCOUNTER — Encounter: Payer: Self-pay | Admitting: Cardiology

## 2022-03-15 ENCOUNTER — Ambulatory Visit: Payer: PPO | Attending: Cardiology | Admitting: Cardiology

## 2022-03-15 VITALS — BP 112/68 | HR 70 | Ht 62.0 in | Wt 130.8 lb

## 2022-03-15 DIAGNOSIS — I7121 Aneurysm of the ascending aorta, without rupture: Secondary | ICD-10-CM | POA: Diagnosis not present

## 2022-03-15 DIAGNOSIS — I2510 Atherosclerotic heart disease of native coronary artery without angina pectoris: Secondary | ICD-10-CM

## 2022-03-15 DIAGNOSIS — R002 Palpitations: Secondary | ICD-10-CM

## 2022-03-15 DIAGNOSIS — R0609 Other forms of dyspnea: Secondary | ICD-10-CM | POA: Diagnosis not present

## 2022-03-15 DIAGNOSIS — I10 Essential (primary) hypertension: Secondary | ICD-10-CM

## 2022-03-15 NOTE — Progress Notes (Signed)
Cardiology Office Note:    Date:  03/15/2022   ID:  Katelyn Cole, DOB 12/11/1930, MRN [id number]  PCP:  Maris Berger, MD  Cardiologist:  Jenne Campus, MD    Referring MD: Maris Berger, MD   Chief Complaint  Patient presents with   Follow-up  Doing much better  History of Present Illness:    Katelyn Cole is a 88 y.o. female past medical history significant for ascending arctic was measuring 40 mm, ectasia involving descending thoracic aorta, essential hypertension, dyslipidemia.  Last time I seen her she suffered from COVID-19 infection because of symptomatology we decided to order coronary CT angio that test showed calcifications of coronary artery but only mild, fractional flow reserve did not show any hemodynamically significant stenosis.  She comes today to my office because she would like to have her aorta checked make sure did not get any larger.  Overall she is doing very well still very active and have to admit for somebody was going to be 88 years old with the next 2 months she looks pretty good  Past Medical History:  Diagnosis Date   Aneurysm of thoracic aorta ([place]) 01/03/2013   Anxiety 05/21/2015   Arthritis    Basal cell carcinoma    left leg   CAD (coronary artery disease) 09/25/7626   Complication of anesthesia    Difficult to awaken, spinal anesthesia cause extreme hypotension   COVID 08/2020   Depression, major, in remission ([place]) 05/21/2015   Dyspnea on exertion 06/23/2016   Essential hypertension 06/23/2016   History of iron deficiency anemia    Hyperlipidemia 05/21/2015   Hypertensive heart disease 05/21/2015   Palpitations 06/23/2016   PONV (postoperative nausea and vomiting)    Staph aureus infection    left leg after basal cell excision   Syncope and collapse 06/23/2016    Past Surgical History:  Procedure Laterality Date   ABDOMINAL HYSTERECTOMY     BASAL CELL CARCINOMA EXCISION     birthmark removal  Left    forearm   BLADDER  SUSPENSION     COLONOSCOPY     FRACTURE SURGERY Left 2007   Wrist   NASAL SINUS SURGERY     PARTIAL KNEE ARTHROPLASTY Left 10/29/2018   Procedure: UNICOMPARTMENTAL LEFT KNEE;  Surgeon: Vickey Huger, MD;  Location: [facility];  Service: Orthopedics;  Laterality: Left;   UPPER GI ENDOSCOPY      Current Medications: Current Meds  Medication Sig   Alpha-Lipoic Acid 200 MG CAPS Take 200 mg by mouth in the morning and at bedtime.   amLODipine (NORVASC) 5 MG tablet Take 5 mg by mouth daily.   aspirin EC 81 MG tablet Take 81 mg by mouth daily. Swallow whole.   calcium citrate-vitamin D (CITRACAL+D) 315-200 MG-UNIT tablet Take 1 tablet by mouth 2 (two) times daily.   carvedilol (COREG) 6.25 MG tablet Take 6.25 mg by mouth 2 (two) times daily.    Cyanocobalamin (VITAMIN B-12) 500 MCG LOZG Take 500 mcg by mouth daily.    furosemide (LASIX) 20 MG tablet Take 20 mg by mouth 3 (three) times a week.   hydrochlorothiazide (HYDRODIURIL) 25 MG tablet Take 25 mg by mouth daily.   losartan (COZAAR) 100 MG tablet Take 100 mg by mouth daily.    Multiple Vitamin (MULTI-VITAMINS) TABS Take 1 tablet by mouth daily. Unknown strenght   Omega-3 1000 MG CAPS Take 1 g by mouth daily.   pravastatin (PRAVACHOL) 40 MG tablet Take 40 mg  by mouth at bedtime.    sertraline (ZOLOFT) 25 MG tablet Take 25 mg by mouth daily.    [DISCONTINUED] metoprolol tartrate (LOPRESSOR) 50 MG tablet Take 2 hours before CT scan (Patient taking differently: Take by mouth 2 (two) times daily. Take 2 hours before CT scan)     Allergies:   Atorvastatin, Lisinopril, Losartan, and Rosuvastatin   Social History   Socioeconomic History   Marital status: Widowed    Spouse name: Not on file   Number of children: Not on file   Years of education: Not on file   Highest education level: Not on file  Occupational History   Not on file  Tobacco Use   Smoking status: Never   Smokeless tobacco: Never  Vaping Use   Vaping Use: Never used   Substance and Sexual Activity   Alcohol use: Not Currently   Drug use: Never   Sexual activity: Not Currently    Birth control/protection: Surgical  Other Topics Concern   Not on file  Social History Narrative   Not on file   Social Determinants of Health   Financial Resource Strain: Not on file  Food Insecurity: Not on file  Transportation Needs: Not on file  Physical Activity: Not on file  Stress: Not on file  Social Connections: Not on file     Family History: The patient's family history includes Aneurysm in her brother and mother; Heart disease in her mother. ROS:   Please see the history of present illness.    All 14 point review of systems negative except as described per history of present illness  EKGs/Labs/Other Studies Reviewed:      Recent Labs: No results found for requested labs within last 365 days.  Recent Lipid Panel No results found for: "CHOL", "TRIG", "HDL", "CHOLHDL", "VLDL", "LDLCALC", "LDLDIRECT"  Physical Exam:    VS:  BP 112/68 (BP Location: Left Arm, Patient Position: Sitting)   Pulse 70   Ht '5\' 2"'$  (3.704 m)   Wt 130 lb 12.8 oz (59.3 kg)   SpO2 97%   BMI 23.92 kg/m     Wt Readings from Last 3 Encounters:  03/15/22 130 lb 12.8 oz (59.3 kg)  11/11/20 127 lb (57.6 kg)  11/21/19 130 lb 12.8 oz (59.3 kg)     GEN:  Well nourished, well developed in no acute distress HEENT: Normal NECK: No JVD; No carotid bruits LYMPHATICS: No lymphadenopathy CARDIAC: RRR, no murmurs, no rubs, no gallops RESPIRATORY:  Clear to auscultation without rales, wheezing or rhonchi  ABDOMEN: Soft, non-tender, non-distended MUSCULOSKELETAL:  No edema; No deformity  SKIN: Warm and dry LOWER EXTREMITIES: no swelling NEUROLOGIC:  Alert and oriented x 3 PSYCHIATRIC:  Normal affect   ASSESSMENT:    1. Aneurysm of ascending aorta without rupture ([place])   2. Coronary artery disease involving native heart, unspecified vessel or lesion type, unspecified whether  angina present   3. Essential hypertension   4. Dyspnea on exertion   5. Palpitations    PLAN:    In order of problems listed above:  Unresolved ascending aorta I will schedule her to have CT of her chest to follow-up on size. Essential hyper tension blood pressure very well-controlled continue present management, Dyspnea on exertion denies having any Palpitations: Denies having any Dyslipidemia I did review her blood test from 2 years ago where her LDL was 77 HDL 44 she is taking pravastatin 40.  Will make arrangements for her cholesterol to be checked.   Medication  Adjustments/Labs and Tests Ordered: Current medicines are reviewed at length with the patient today.  Concerns regarding medicines are outlined above.  No orders of the defined types were placed in this encounter.  Medication changes: No orders of the defined types were placed in this encounter.   Signed, Park Liter, MD, [facility] 03/15/2022 1:54 PM    [facility]

## 2022-03-15 NOTE — Addendum Note (Signed)
Addended by: Truddie Hidden on: 03/15/2022 02:27 PM   Modules accepted: Orders

## 2022-03-15 NOTE — Patient Instructions (Signed)
Medication Instructions:  Your physician recommends that you continue on your current medications as directed. Please refer to the Current Medication list given to you today.  *If you need a refill on your cardiac medications before your next appointment, please call your pharmacy*   Lab Work: None ordered If you have labs (blood work) drawn today and your tests are completely normal, you will receive your results only by: [place] (if you have MyChart) OR A paper copy in the mail If you have any lab test that is abnormal or we need to change your treatment, we will call you to review the results.   Testing/Procedures: Non-Cardiac CT scanning, (CAT scanning), is a noninvasive, special x-ray that produces cross-sectional images of the body using x-rays and a computer. CT scans help physicians diagnose and treat medical conditions. For some CT exams, a contrast material is used to enhance visibility in the area of the body being studied. CT scans provide greater clarity and reveal more details than regular x-ray exams.   This will be done at [facility]. Call [phone] option 7 to schedule once we call you after pre-cert.   Follow-Up: At [facility], you and your health needs are our priority.  As part of our continuing mission to provide you with exceptional heart care, we have created designated Provider Care Teams.  These Care Teams include your primary Cardiologist (physician) and Advanced Practice Providers (APPs -  Physician Assistants and Nurse Practitioners) who all work together to provide you with the care you need, when you need it.  We recommend signing up for the patient portal called "MyChart".  Sign up information is provided on this After Visit Summary.  MyChart is used to connect with patients for Virtual Visits (Telemedicine).  Patients are able to view lab/test results, encounter notes, upcoming appointments, etc.  Non-urgent messages can be sent to your  provider as well.   To learn more about what you can do with MyChart, go to [email or web address].    Your next appointment:   12 month(s)  The format for your next appointment:   In Person  Provider:   Jenne Campus, MD   Other Instructions NA

## 2022-03-17 ENCOUNTER — Telehealth: Payer: Self-pay | Admitting: Cardiology

## 2022-03-17 NOTE — Telephone Encounter (Signed)
Patient would like to inform Dr. Agustin Cree that she has switched to new PCP, Dr. Bertram Millard of [facility] and any information/results being sent to PCP need to now go to Dr. Humphrey Rolls.

## 2022-03-17 NOTE — Telephone Encounter (Signed)
Called patient and left a detailed message that her PCP information in her patient's chart had been switched to Bertram Millard, MD at [facility] and any information for the PCP will now go to Dr. Humphrey Rolls.

## 2022-03-22 ENCOUNTER — Telehealth: Payer: Self-pay | Admitting: Cardiology

## 2022-03-22 NOTE — Telephone Encounter (Signed)
Office called requesting that order for CT be fax ed to their office. Please advise

## 2022-03-22 NOTE — Telephone Encounter (Signed)
Order sent to front desk to schedule and fax

## 2022-03-25 ENCOUNTER — Encounter: Payer: Self-pay | Admitting: Cardiology

## 2022-04-07 ENCOUNTER — Telehealth: Payer: Self-pay

## 2022-04-07 NOTE — Telephone Encounter (Signed)
Results reviewed with pt as per Dr. Krasowski's note.  Pt verbalized understanding and had no additional questions. Routed to PCP  

## 2022-04-11 DIAGNOSIS — R059 Cough, unspecified: Secondary | ICD-10-CM | POA: Diagnosis not present

## 2022-04-11 DIAGNOSIS — R519 Headache, unspecified: Secondary | ICD-10-CM | POA: Diagnosis not present

## 2022-05-31 DIAGNOSIS — L820 Inflamed seborrheic keratosis: Secondary | ICD-10-CM | POA: Diagnosis not present

## 2022-05-31 DIAGNOSIS — L821 Other seborrheic keratosis: Secondary | ICD-10-CM | POA: Diagnosis not present

## 2022-05-31 DIAGNOSIS — L570 Actinic keratosis: Secondary | ICD-10-CM | POA: Diagnosis not present

## 2022-05-31 DIAGNOSIS — L578 Other skin changes due to chronic exposure to nonionizing radiation: Secondary | ICD-10-CM | POA: Diagnosis not present

## 2022-05-31 DIAGNOSIS — C44622 Squamous cell carcinoma of skin of right upper limb, including shoulder: Secondary | ICD-10-CM | POA: Diagnosis not present

## 2022-07-31 DIAGNOSIS — J988 Other specified respiratory disorders: Secondary | ICD-10-CM | POA: Diagnosis not present

## 2022-08-25 DIAGNOSIS — R1030 Lower abdominal pain, unspecified: Secondary | ICD-10-CM | POA: Diagnosis not present

## 2022-08-25 DIAGNOSIS — N3001 Acute cystitis with hematuria: Secondary | ICD-10-CM | POA: Diagnosis not present

## 2022-09-21 DIAGNOSIS — I10 Essential (primary) hypertension: Secondary | ICD-10-CM | POA: Diagnosis not present

## 2022-09-21 DIAGNOSIS — Z6824 Body mass index (BMI) 24.0-24.9, adult: Secondary | ICD-10-CM | POA: Diagnosis not present

## 2022-09-21 DIAGNOSIS — I712 Thoracic aortic aneurysm, without rupture, unspecified: Secondary | ICD-10-CM | POA: Diagnosis not present

## 2022-10-17 ENCOUNTER — Telehealth: Payer: Self-pay | Admitting: Cardiology

## 2022-10-17 NOTE — Telephone Encounter (Signed)
Called the patient's daughter Synetta Fail, and she reported that the patient has 2 aortic aneurysms and she has been feeling weak and dizzy. Her blood pressure has ranging from 126/80 to 100/60 HR 116. This morning the patient is not dizzy but she does feel tired. An appointment was scheduled for her to see Wallis Bamberg NP on 10/18/22, by the call center. The patient's daughter is comfortable waiting until the appointment tomorrow. I also explained that if the patient got worse that she could always take her to the ER. The daughter agreed and verbalized understanding and had no further questions at this time.

## 2022-10-17 NOTE — Telephone Encounter (Signed)
Pt c/o Syncope: STAT if syncope occurred within 30 minutes and pt complains of lightheadedness High Priority if episode of passing out, completely, today or in last 24 hours   Did you pass out today? no   When is the last time you passed out? Have not passed out, she feels like she is going to pass out   Has this occurred multiple times?  3timesn   Did you have any symptoms prior to passing out? Feel weak, before she feels like she is going to pass out- daughter wanted patient to be seen- I made an appointment for tomorrow(10-18-22) with Wallis Bamberg- please call to [facility]

## 2022-10-18 ENCOUNTER — Ambulatory Visit: Payer: PPO | Attending: Cardiology | Admitting: Cardiology

## 2022-10-18 ENCOUNTER — Encounter: Payer: Self-pay | Admitting: Cardiology

## 2022-10-18 VITALS — BP 122/60 | HR 62 | Ht 62.0 in | Wt 135.0 lb

## 2022-10-18 DIAGNOSIS — I7121 Aneurysm of the ascending aorta, without rupture: Secondary | ICD-10-CM | POA: Diagnosis not present

## 2022-10-18 DIAGNOSIS — R011 Cardiac murmur, unspecified: Secondary | ICD-10-CM | POA: Diagnosis not present

## 2022-10-18 NOTE — Patient Instructions (Signed)
Medication Instructions:  Your physician recommends that you continue on your current medications as directed. Please refer to the Current Medication list given to you today.  *If you need a refill on your cardiac medications before your next appointment, please call your pharmacy*   Lab Work: NONE If you have labs (blood work) drawn today and your tests are completely normal, you will receive your results only by: MyChart Message (if you have MyChart) OR A paper copy in the mail If you have any lab test that is abnormal or we need to change your treatment, we will call you to review the results.   Testing/Procedures: Your physician has requested that you have an echocardiogram. Echocardiography is a painless test that uses sound waves to create images of your heart. It provides your doctor with information about the size and shape of your heart and how well your heart's chambers and valves are working. This procedure takes approximately one hour. There are no restrictions for this procedure. Please do NOT wear cologne, perfume, aftershave, or lotions (deodorant is allowed). Please arrive 15 minutes prior to your appointment time.   Non-Cardiac CT scanning, (CAT scanning), is a noninvasive, special x-ray that produces cross-sectional images of the body using x-rays and a computer. CT scans help physicians diagnose and treat medical conditions. For some CT exams, a contrast material is used to enhance visibility in the area of the body being studied. CT scans provide greater clarity and reveal more details than regular x-ray exams.    Follow-Up: At [facility], you and your health needs are our priority.  As part of our continuing mission to provide you with exceptional heart care, we have created designated Provider Care Teams.  These Care Teams include your primary Cardiologist (physician) and Advanced Practice Providers (APPs -  Physician Assistants and Nurse Practitioners) who all  work together to provide you with the care you need, when you need it.  We recommend signing up for the patient portal called "MyChart".  Sign up information is provided on this After Visit Summary.  MyChart is used to connect with patients for Virtual Visits (Telemedicine).  Patients are able to view lab/test results, encounter notes, upcoming appointments, etc.  Non-urgent messages can be sent to your provider as well.   To learn more about what you can do with MyChart, go to [email or web address].    Your next appointment:   6 month(s)  Provider:   Gypsy Balsam, MD    Other Instructions

## 2022-10-18 NOTE — Progress Notes (Signed)
Cardiology Office Note:  .   Date:  10/18/2022  ID:  Katelyn Cole, DOB 1930/10/10, MRN [id number] PCP: Lise Auer, MD  [facility] Providers Cardiologist:  None    History of Present Illness: .   Katelyn Cole is a 89 y.o. female with a past medical tree of aneurysm of thoracic aorta, nonobstructive CAD, hypertension, hyperlipidemia, palpitations, history of syncope and collapse, history of IDA.  11/13/2020 coronary CTA revealed a coronary calcium score 462, moderate stenosis in the LAD, FFR was negative 03/22/2022 CTA of the chest revealing coronary artery calcifications as well as TAA dilated to 4.4 cm which was increased from the previous 4.1 cm. 05/18/2018 echo EF 60 to 65%, impaired relaxation, mild thickening of the mitral valve leaflet with mild calcification, mild thickening of the aortic valve, moderate dilatation of the ascending aorta 43 mm 05/09/2018 Lexiscan no ischemia  Most recently she was evaluated by Dr. Bing Matter in December 2023, at this time she was doing well from a cardiac perspective.  Repeat CT of the chest was arranged for surveillance of her thoracic aortic aneurysm.  This was completed on 03/22/2022 revealing coronary artery calcifications as well as TAA dilated to 4.4 cm which was increased from the previous 4.1 cm.  She presents today after an episode on Friday where she noticed her blood pressure was lower than normal, around 100 systolic, she also felt lightheaded and presyncopal.  She laid down and rested on the couch, symptoms ultimately subsided.  She is staying well-hydrated with mostly water.  She is very independent and active.  Her blood pressure today is 122/60 and well-controlled. She denies chest pain, palpitations, dyspnea, pnd, orthopnea, n, v, dizziness, syncope, edema, weight gain, or early satiety.    ROS: Review of Systems  Constitutional: Negative.   HENT: Negative.    Eyes: Negative.   Respiratory: Negative.    Cardiovascular:  Negative.   Gastrointestinal: Negative.   Genitourinary: Negative.   Musculoskeletal: Negative.   Skin: Negative.   Neurological:  Positive for dizziness.  Endo/Heme/Allergies: Negative.   Psychiatric/Behavioral: Negative.       Studies Reviewed: .        Cardiac Studies & Procedures     STRESS TESTS  MYOCARDIAL PERFUSION IMAGING 05/09/2018  Narrative  The left ventricular ejection fraction is hyperdynamic (>65%).  Nuclear stress EF: 73%.  Blood pressure demonstrated a hypertensive response to exercise.  There was no ST segment deviation noted during stress.  Defect 1: There is a medium defect of moderate severity present in the basal anterior, mid anterior and apical anterior location.  This is a low risk study.  Noevidence of ischemia or MI. Nomal EF. Hypertensive response.   ECHOCARDIOGRAM  ECHOCARDIOGRAM COMPLETE 05/18/2018  Narrative ECHOCARDIOGRAM REPORT    Patient Name:   Katelyn Cole Date of Exam: 05/18/2018 Medical Rec #:  [id number]    Height:       62.0 in Accession #:    [id number]   Weight:       130.6 lb Date of Birth:  [birth year]     BSA:          1.59 m Patient Age:    88 years     BP:           106/70 mmHg Patient Gender: F            HR:           54 bpm. Exam Location:  [place]   Procedure:  2D Echo  Indications:    Essential hypertension [I10] - Primary Thoracic aortic aneurysm without rupture (HCC) [I71.2]  History:        Patient has no prior history of Echocardiogram examinations.  Sonographer:    Louie Boston Referring Phys: [phone] ROBERT J KRASOWSKI  IMPRESSIONS   1. The left ventricle has normal systolic function with an ejection fraction of 60-65%. The cavity size was normal. There is mildly increased left ventricular wall thickness. Left ventricular diastolic Doppler parameters are consistent with impaired relaxation. 2. The right ventricle has normal systolic function. The cavity was normal. There is no increase in right  ventricular wall thickness. 3. The mitral valve is normal in structure. Mild thickening of the mitral valve leaflet. Mild calcification of the mitral valve leaflet. 4. The tricuspid valve is normal in structure. 5. The aortic valve is tricuspid Mild thickening of the aortic valve Mild calcification of the aortic valve. 6. Aneurysm of the ascending aorta. 7. There is moderate dilatation of the ascending aorta measuring 43 mm.  FINDINGS Left Ventricle: The left ventricle has normal systolic function, with an ejection fraction of 60-65%. The cavity size was normal. There is mildly increased left ventricular wall thickness. Left ventricular diastolic Doppler parameters are consistent with impaired relaxation Normal left ventricular filling pressures Right Ventricle: The right ventricle has normal systolic function. The cavity was normal. There is no increase in right ventricular wall thickness. Left Atrium: left atrial size was normal in size Right Atrium: right atrial size was normal in size Right atrial pressure is estimated at 3 mmHg. Interatrial Septum: No atrial level shunt detected by color flow Doppler. Pericardium: There is no evidence of pericardial effusion. Mitral Valve: The mitral valve is normal in structure. Mild thickening of the mitral valve leaflet. Mild calcification of the mitral valve leaflet. Mitral valve regurgitation is mild by color flow Doppler. Tricuspid Valve: The tricuspid valve is normal in structure. Tricuspid valve regurgitation is trivial by color flow Doppler. Aortic Valve: The aortic valve is tricuspid Mild thickening of the aortic valve and Mild calcification of the aortic valve Aortic valve regurgitation was not visualized by color flow Doppler. Pulmonic Valve: The pulmonic valve was normal in structure. Pulmonic valve regurgitation is trivial by color flow Doppler. Aorta: There is moderate dilatation of the ascending aorta measuring 43 mm. There is an aneurysm  involving the ascending aorta. Venous: The inferior vena cava measures 1.60 cm, is normal in size with greater than 50% respiratory variability.  LEFT VENTRICLE PLAX 2D (Teich) LV EF:          61.0 %   Diastology LVIDd:          4.60 cm  LV e' lateral:   4.68 cm/s LVIDs:          3.10 cm  LV E/e' lateral: 15.4 LV PW:          1.40 cm  LV e' medial:    2.72 cm/s LV IVS:         1.30 cm  LV E/e' medial:  26.6 LVOT diam:      1.80 cm LV SV:          59 ml LVOT Area:      2.54 cm  RIGHT VENTRICLE RV S prime:     8.81 cm/s TAPSE (M-mode): 1.7 cm RVSP:           19.3 mmHg  LEFT ATRIUM  Index       RIGHT ATRIUM           Index LA diam:        3.80 cm 2.38 cm/m  RA Pressure: 3 mmHg LA Vol (A2C):   44.4 ml 27.84 ml/m RA Area:     12.10 cm LA Vol (A4C):   47.6 ml 29.84 ml/m RA Volume:   23.90 ml  14.98 ml/m LA Biplane Vol: 46.6 ml 29.22 ml/m AORTIC VALVE LVOT Vmax:   79.80 cm/s LVOT Vmean:  54.600 cm/s LVOT VTI:    0.215 m  AORTA Ao Root diam:  3.50 cm Ao Sinus diam: 3.90 cm Ao STJ diam:   3.6 cm Ao Asc diam:   4.23 cm  MITRAL VALVE               TRICUSPID VALVE MV Area (PHT): 2.42 cm    TR Peak grad:   16.3 mmHg MV PHT:        90.77 msec  TR Vmax:        213.00 cm/s MV Decel Time: 313 msec    RVSP:           19.3 mmHg MV E velocity: 72.30 cm/s MV A velocity: 122.00 cm/s MV E/A ratio:  0.59  IVC IVC diam: 1.60 cm   Gypsy Balsam MD Electronically signed by Gypsy Balsam MD Signature Date/Time: 05/18/2018/12:32:59 PM    Final     CT SCANS  CT CORONARY MORPH W/CTA COR W/SCORE 11/13/2020  Addendum 11/15/2020 10:20 AM ADDENDUM REPORT: 11/15/2020 10:17  EXAM: CT FFR ANALYSIS  FINDINGS: FFRct analysis was performed on the original cardiac CT angiogram dataset. Diagrammatic representation of the FFRct analysis is provided in a separate PDF document in PACS. This dictation was created using the PDF document and an interactive 3D model of  the results. 3D model is not available in the EMR/PACS. Normal FFR range is >0.80.  1. Left Main: FFR - 0.99 - no significant stenosis.  2. LAD: Prox 0.99, mid 0.87, distal 0.78.  No significant stenosis. 3. LCX: Prox 0.99, mid 0.98, distal - not calculated. No significant stenosis. 4. RCA: Prox 0.98, mid 0.83, distal 0.82.  No significant stenosis.  IMPRESSION: 1. CT FFR analysis didn't show any hemodynamically significant stenosis.   Electronically Signed By: Gypsy Balsam M.D. On: 11/15/2020 10:17  Addendum 11/15/2020 10:13 AM ADDENDUM REPORT: 11/15/2020 10:10  CLINICAL DATA:  Atypical cp, risks for CAD, Hx of aortic aneurysm.  EXAM: Cardiac/Coronary  CTA  TECHNIQUE: The patient was scanned on a [employer].  FINDINGS: A 110 kV prospective scan was triggered in the descending thoracic aorta at 111 HU's. Axial non-contrast 3 mm slices were carried out through the heart. The data set was analyzed on a dedicated work station and scored using the Agatson method. Gantry rotation speed was 250 msecs and collimation was .6 mm. No beta blockade and 0.8 mg of sl NTG was given. The 3D data set was reconstructed in 5% intervals of the 67-82 % of the R-R cycle. Diastolic phases were analyzed on a dedicated work station using MPR, MIP and VRT modes. The patient received 80 cc of contrast.  Aorta: Ectasia of the ascending aortae noted. Maximal diameter 41 mm. Mild calcifications noted in ascending aortae, aortic arch and descending thoracic aortae. No dissection.  Aortic Valve:  Trileaflet.  Mild calcifications noted.  Coronary Arteries:  Normal coronary origin.  Right dominance.  RCA is a large dominant artery that  gives rise to PDA and PLA. There is large, mixed plaque with moderate stenosis (50-69%) in the mid portion of this artery.  Left main is a large artery that gives rise to LAD, intermediate branch and LCX arteries. There are calcified plaques noted  in this artery with mild non-obstructive stenosis (25-49%).  LAD is a large vessel that has mixed plaque in its mid portion that cause moderate stenosis (50-69%).This artery gives rise to small D1.  Intermediate branch has calcified plaque in its proximal portion with mild stenosis.  LCX is a non-dominant artery that gives rise to one moderate size OM1 branch. There is no plaque.  Other findings:  Normal pulmonary vein drainage into the left atrium.  Normal left atrial appendage without a thrombus.  Normal size of the pulmonary artery.  IMPRESSION: 1. Coronary calcium score of 462.  2. Normal coronary origin with right dominance.  3. CAD-RADS 3. Moderate stenosis. Consider symptom-guided anti-ischemic pharmacotherapy as well as risk factor modification per guideline directed care. Additional analysis with CT FFR will be submitted.   Electronically Signed By: Gypsy Balsam M.D. On: 11/15/2020 10:10  Narrative EXAM: OVER-READ INTERPRETATION  CT CHEST  The following report is an over-read performed by radiologist Dr. Trudie Reed of [facility] on 11/13/2020. This over-read does not include interpretation of cardiac or coronary anatomy or pathology. The coronary calcium score/coronary CTA interpretation by the cardiologist is attached.  COMPARISON:  Chest CTA 11/28/2019.  FINDINGS: Extracardiac findings will be described separately under dictation for contemporaneously obtained chest CTA.  IMPRESSION: Please see separate dictation for contemporaneously obtained chest CTA 11/13/2020 for full description of relevant extracardiac findings.  Electronically Signed: By: Trudie Reed M.D. On: 11/13/2020 16:34          Risk Assessment/Calculations:             Physical Exam:   VS:  BP 122/60 (BP Location: Left Arm, Patient Position: Sitting, Cuff Size: Normal)   Pulse 62   Ht 5\' 2"  (1.575 m)   Wt 135 lb (61.2 kg)   SpO2 91%   BMI  24.69 kg/m    Wt Readings from Last 3 Encounters:  10/18/22 135 lb (61.2 kg)  03/15/22 130 lb 12.8 oz (59.3 kg)  11/11/20 127 lb (57.6 kg)    GEN: Well nourished, well developed in no acute distress NECK: No JVD; No carotid bruits CARDIAC: RRR, 3/6 systolic murmur, rubs, gallops RESPIRATORY:  Clear to auscultation without rales, wheezing or rhonchi  ABDOMEN: Soft, non-tender, non-distended EXTREMITIES:  No edema; No deformity   ASSESSMENT AND PLAN: .   Coronary artery disease-nonobstructive per coronary CTA in 2022; Stable with no anginal symptoms. No indication for ischemic evaluation.  Continue aspirin 81 mg daily, continue Coreg 6.25 mg twice daily, continue pravastatin 40 mg daily.  Hypertension-blood pressure is well-controlled today at 122/60, she did have an episode on Friday where her systolic was around 100 and she also felt dizzy.  We discussed decreasing her Norvasc or her Coreg.  For now she would like to have her Coreg to 3.125 mg twice daily for 2 weeks and see if she feels any better.  She will notify our office if she would like to make this a permanent change.  Hyperlipidemia-this is managed by her PCP, continue Pravachol 40 mg daily.  Systolic murmur - most recent echo in 2020 mild thickening of the aortic valve and mild calcification of the mitral leaflet.  Will repeat echocardiogram  Thoracic aortic aneurysm-CTA  of the chest in December 2023 revealed slight increase in diameter, will arrange for repeat CTA of the chest in December 2024.  Continue with good blood pressure control.     Dispo: Echocardiogram, repeat CTA of the chest in December 2024, follow-up with primary cardiologist after this testing.  Signed, Flossie Dibble, NP

## 2022-10-27 DIAGNOSIS — L820 Inflamed seborrheic keratosis: Secondary | ICD-10-CM | POA: Diagnosis not present

## 2022-10-27 DIAGNOSIS — L821 Other seborrheic keratosis: Secondary | ICD-10-CM | POA: Diagnosis not present

## 2022-10-27 DIAGNOSIS — L578 Other skin changes due to chronic exposure to nonionizing radiation: Secondary | ICD-10-CM | POA: Diagnosis not present

## 2022-11-11 ENCOUNTER — Ambulatory Visit: Payer: PPO | Attending: Cardiology

## 2022-11-11 DIAGNOSIS — R011 Cardiac murmur, unspecified: Secondary | ICD-10-CM | POA: Diagnosis not present

## 2022-11-11 DIAGNOSIS — I7121 Aneurysm of the ascending aorta, without rupture: Secondary | ICD-10-CM | POA: Diagnosis not present

## 2022-11-11 LAB — ECHOCARDIOGRAM COMPLETE
AR max vel: 1.98 cm2
AV Area VTI: 1.98 cm2
AV Area mean vel: 1.88 cm2
AV Mean grad: 4.6 mmHg
AV Peak grad: 8.9 mmHg
Ao pk vel: 1.49 m/s
Area-P 1/2: 2.11 cm2
MV VTI: 1.85 cm2
S' Lateral: 2.6 cm

## 2022-12-01 DIAGNOSIS — M85861 Other specified disorders of bone density and structure, right lower leg: Secondary | ICD-10-CM | POA: Diagnosis not present

## 2022-12-01 DIAGNOSIS — M25561 Pain in right knee: Secondary | ICD-10-CM | POA: Diagnosis not present

## 2022-12-01 DIAGNOSIS — M1711 Unilateral primary osteoarthritis, right knee: Secondary | ICD-10-CM | POA: Diagnosis not present

## 2022-12-01 DIAGNOSIS — M25461 Effusion, right knee: Secondary | ICD-10-CM | POA: Diagnosis not present

## 2022-12-01 DIAGNOSIS — G8929 Other chronic pain: Secondary | ICD-10-CM | POA: Diagnosis not present

## 2023-01-05 DIAGNOSIS — G8929 Other chronic pain: Secondary | ICD-10-CM | POA: Diagnosis not present

## 2023-01-05 DIAGNOSIS — M25561 Pain in right knee: Secondary | ICD-10-CM | POA: Diagnosis not present

## 2023-01-09 DIAGNOSIS — M25561 Pain in right knee: Secondary | ICD-10-CM | POA: Diagnosis not present

## 2023-01-13 DIAGNOSIS — M25561 Pain in right knee: Secondary | ICD-10-CM | POA: Diagnosis not present

## 2023-01-16 DIAGNOSIS — M25561 Pain in right knee: Secondary | ICD-10-CM | POA: Diagnosis not present

## 2023-01-20 DIAGNOSIS — M25561 Pain in right knee: Secondary | ICD-10-CM | POA: Diagnosis not present

## 2023-01-23 DIAGNOSIS — M25561 Pain in right knee: Secondary | ICD-10-CM | POA: Diagnosis not present

## 2023-01-25 DIAGNOSIS — M25561 Pain in right knee: Secondary | ICD-10-CM | POA: Diagnosis not present

## 2023-01-30 DIAGNOSIS — M25561 Pain in right knee: Secondary | ICD-10-CM | POA: Diagnosis not present

## 2023-02-01 DIAGNOSIS — M25561 Pain in right knee: Secondary | ICD-10-CM | POA: Diagnosis not present

## 2023-02-05 DIAGNOSIS — R399 Unspecified symptoms and signs involving the genitourinary system: Secondary | ICD-10-CM | POA: Diagnosis not present

## 2023-02-05 DIAGNOSIS — R1084 Generalized abdominal pain: Secondary | ICD-10-CM | POA: Diagnosis not present

## 2023-02-06 DIAGNOSIS — H5203 Hypermetropia, bilateral: Secondary | ICD-10-CM | POA: Diagnosis not present

## 2023-02-06 DIAGNOSIS — H353131 Nonexudative age-related macular degeneration, bilateral, early dry stage: Secondary | ICD-10-CM | POA: Diagnosis not present

## 2023-02-06 DIAGNOSIS — H3530 Unspecified macular degeneration: Secondary | ICD-10-CM | POA: Diagnosis not present

## 2023-02-06 DIAGNOSIS — H1840 Unspecified corneal degeneration: Secondary | ICD-10-CM | POA: Diagnosis not present

## 2023-02-06 DIAGNOSIS — H52223 Regular astigmatism, bilateral: Secondary | ICD-10-CM | POA: Diagnosis not present

## 2023-02-06 DIAGNOSIS — Z961 Presence of intraocular lens: Secondary | ICD-10-CM | POA: Diagnosis not present

## 2023-02-06 DIAGNOSIS — H524 Presbyopia: Secondary | ICD-10-CM | POA: Diagnosis not present

## 2023-02-06 DIAGNOSIS — Z9849 Cataract extraction status, unspecified eye: Secondary | ICD-10-CM | POA: Diagnosis not present

## 2023-02-08 DIAGNOSIS — M25561 Pain in right knee: Secondary | ICD-10-CM | POA: Diagnosis not present

## 2023-02-09 DIAGNOSIS — R1084 Generalized abdominal pain: Secondary | ICD-10-CM | POA: Diagnosis not present

## 2023-02-27 ENCOUNTER — Telehealth: Payer: Self-pay | Admitting: *Deleted

## 2023-02-27 NOTE — Telephone Encounter (Signed)
Order for Calcium Score faxed to [facility].

## 2023-04-18 DIAGNOSIS — I712 Thoracic aortic aneurysm, without rupture, unspecified: Secondary | ICD-10-CM | POA: Diagnosis not present

## 2023-04-25 ENCOUNTER — Encounter: Payer: Self-pay | Admitting: Cardiology

## 2023-04-25 ENCOUNTER — Telehealth: Payer: Self-pay | Admitting: Cardiology

## 2023-04-25 DIAGNOSIS — C44622 Squamous cell carcinoma of skin of right upper limb, including shoulder: Secondary | ICD-10-CM | POA: Diagnosis not present

## 2023-04-25 DIAGNOSIS — L821 Other seborrheic keratosis: Secondary | ICD-10-CM | POA: Diagnosis not present

## 2023-04-25 DIAGNOSIS — L570 Actinic keratosis: Secondary | ICD-10-CM | POA: Diagnosis not present

## 2023-04-25 DIAGNOSIS — L578 Other skin changes due to chronic exposure to nonionizing radiation: Secondary | ICD-10-CM | POA: Diagnosis not present

## 2023-04-25 NOTE — Telephone Encounter (Signed)
Results reviewed with pt as per Wallis Bamberg NP's note.  Pt verbalized understanding and had no additional questions. Routed to PCP.

## 2023-05-01 DIAGNOSIS — Z23 Encounter for immunization: Secondary | ICD-10-CM | POA: Diagnosis not present

## 2023-05-01 DIAGNOSIS — Z1331 Encounter for screening for depression: Secondary | ICD-10-CM | POA: Diagnosis not present

## 2023-05-01 DIAGNOSIS — Z1339 Encounter for screening examination for other mental health and behavioral disorders: Secondary | ICD-10-CM | POA: Diagnosis not present

## 2023-05-01 DIAGNOSIS — Z0000 Encounter for general adult medical examination without abnormal findings: Secondary | ICD-10-CM | POA: Diagnosis not present

## 2023-05-01 DIAGNOSIS — Z6824 Body mass index (BMI) 24.0-24.9, adult: Secondary | ICD-10-CM | POA: Diagnosis not present

## 2023-05-01 DIAGNOSIS — Z79899 Other long term (current) drug therapy: Secondary | ICD-10-CM | POA: Diagnosis not present

## 2023-05-01 DIAGNOSIS — Z9181 History of falling: Secondary | ICD-10-CM | POA: Diagnosis not present

## 2023-05-01 DIAGNOSIS — I10 Essential (primary) hypertension: Secondary | ICD-10-CM | POA: Diagnosis not present

## 2023-05-01 DIAGNOSIS — I712 Thoracic aortic aneurysm, without rupture, unspecified: Secondary | ICD-10-CM | POA: Diagnosis not present

## 2023-05-01 DIAGNOSIS — E7800 Pure hypercholesterolemia, unspecified: Secondary | ICD-10-CM | POA: Diagnosis not present

## 2023-07-22 IMAGING — CT CT HEART MORP W/ CTA COR W/ SCORE W/ CA W/CM &/OR W/O CM
4 of 7 series · 8 of 20 positions shown, 9 images · non-contrast
Comparison: Chest CTA 11/28/2019.
COMPARISON: Chest CTA 11/28/2019.
COMPARISON: Chest CTA 11/28/2019.

Addendum:
EXAM:
OVER-READ INTERPRETATION  CT CHEST

The following report is an over-read performed by radiologist Dr.
Yusor Pharmacist [REDACTED] on 11/13/2020. This
over-read does not include interpretation of cardiac or coronary
anatomy or pathology. The coronary calcium score/coronary CTA
interpretation by the cardiologist is attached.
CLINICAL DATA: Atypical cp, risks for CAD, Hx of aortic aneurysm.
Cardiac/Coronary  CTA
TECHNIQUE: The patient was scanned on a [employer] scanner.

[Series 6: best diast 78 % · axial · 0.39mm/px · z∈[-272,-207]mm · 2 of 488 slices shown, 3 images]
[im 163/488  vessel]
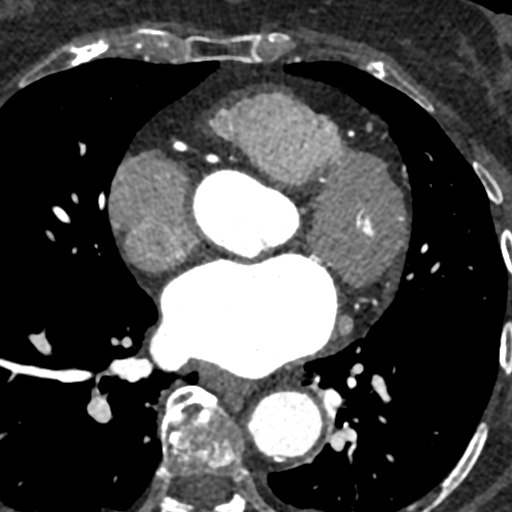
[im 163/488  lung]
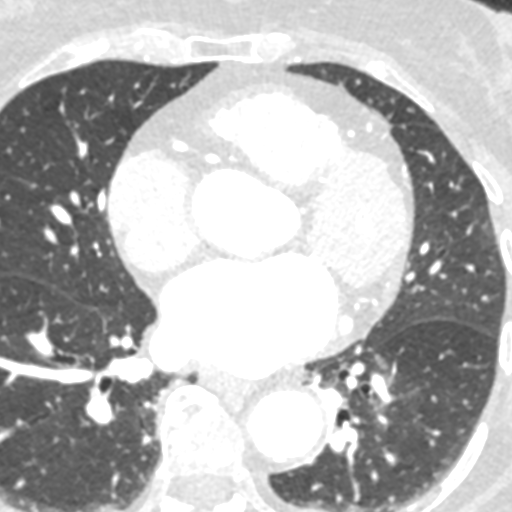
[im 325/488  vessel]
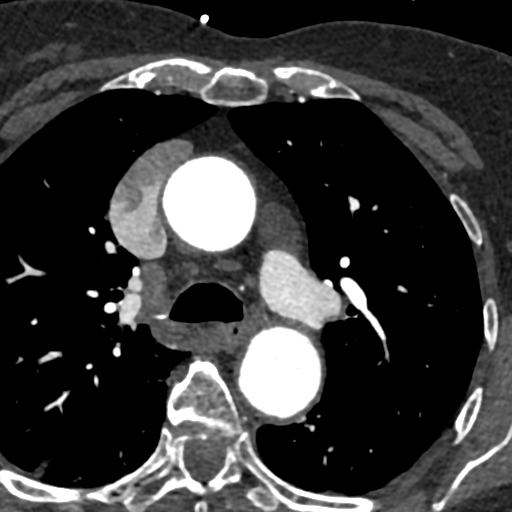

[Series 10: ts syst 37 % · axial · 0.39mm/px · z∈[-272,-207]mm · 2 of 488 slices shown]
[im 163/488  vessel]
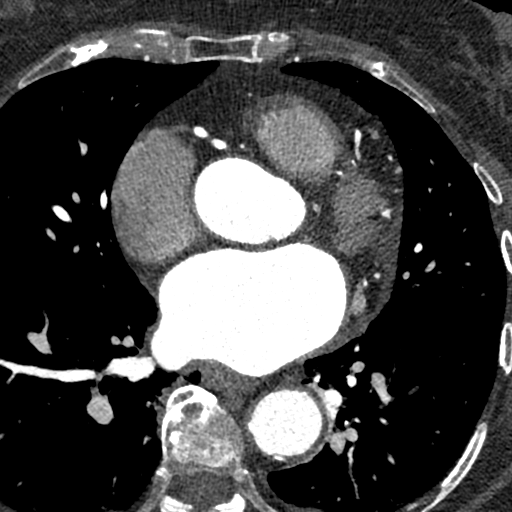
[im 325/488  vessel]
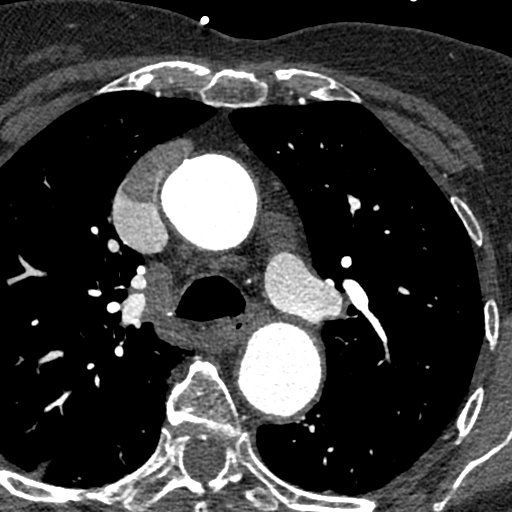

[Series 11: ts diast 78 % · axial · 0.39mm/px · z∈[-272,-207]mm · 2 of 488 slices shown]
[im 163/488  vessel]
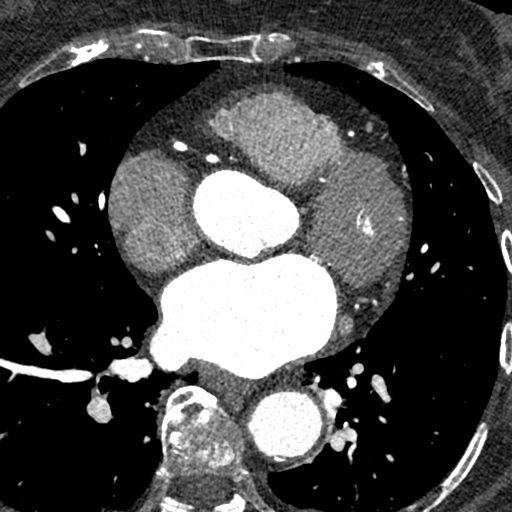
[im 325/488  vessel]
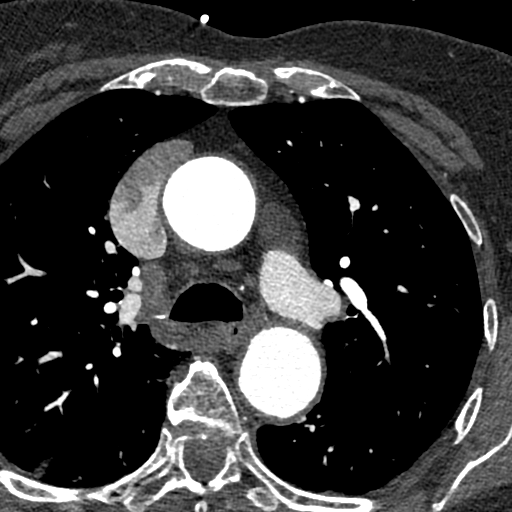

[Series 12: best syst 37 % · axial · 0.39mm/px · z∈[-272,-207]mm · 2 of 488 slices shown]
[im 163/488  vessel]
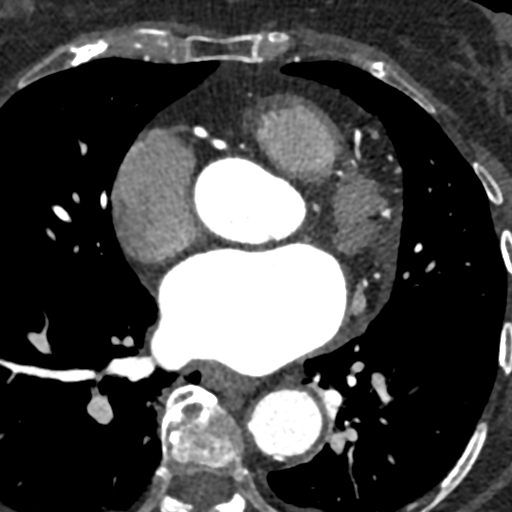
[im 325/488  vessel]
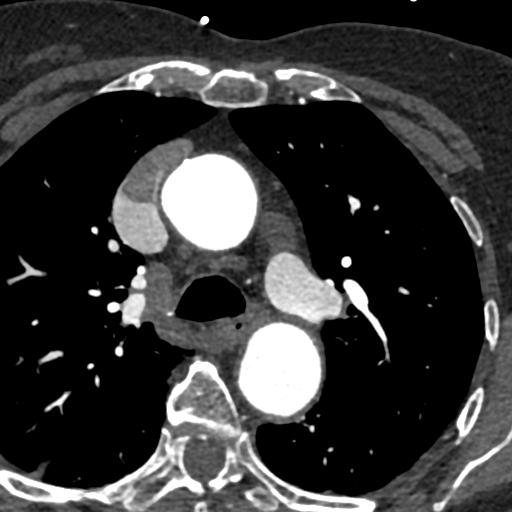

[8 of 20 positions shown; findings below may reference images not displayed]

FINDINGS: Extracardiac findings will be described separately under dictation
for contemporaneously obtained chest CTA.
IMPRESSION: Please see separate dictation for contemporaneously obtained chest
CTA 11/13/2020 for full description of relevant extracardiac
findings.
FINDINGS: A 110 kV prospective scan was triggered in the descending thoracic
aorta at 111 HU's. Axial non-contrast 3 mm slices were carried out
through the heart. The data set was analyzed on a dedicated work
station and scored using the Agatson method. Gantry rotation speed
was 250 msecs and collimation was .6 mm. No beta blockade and 0.8 mg
of sl NTG was given. The 3D data set was reconstructed in 5%
intervals of the 67-82 % of the R-R cycle. Diastolic phases were
analyzed on a dedicated work station using MPR, MIP and VRT modes.
The patient received 80 cc of contrast.

Aorta: Ectasia of the ascending aortae noted. Maximal diameter 41
mm. Mild calcifications noted in ascending aortae, aortic arch and
descending thoracic aortae. No dissection.

Aortic Valve:  Trileaflet.  Mild calcifications noted.

Coronary Arteries:  Normal coronary origin.  Right dominance.

RCA is a large dominant artery that gives rise to PDA and PLA. There
is large, mixed plaque with moderate stenosis (50-69%) in the mid
portion of this artery.

Left main is a large artery that gives rise to LAD, intermediate
branch and LCX arteries. There are calcified plaques noted in this
artery with mild non-obstructive stenosis (25-49%).

LAD is a large vessel that has mixed plaque in its mid portion that
cause moderate stenosis (50-69%).This artery gives rise to small D1.

Intermediate branch has calcified plaque in its proximal portion
with mild stenosis.

LCX is a non-dominant artery that gives rise to one moderate size
OM1 branch. There is no plaque.

Other findings:

Normal pulmonary vein drainage into the left atrium.

Normal left atrial appendage without a thrombus.

Normal size of the pulmonary artery.
IMPRESSION: 1. Coronary calcium score of 462.

2. Normal coronary origin with right dominance.

3. CAD-RADS 3. Moderate stenosis. Consider symptom-guided
anti-ischemic pharmacotherapy as well as risk factor modification
per guideline directed care. Additional analysis with CT FFR will be
submitted.

EXAM:
CT FFR ANALYSIS
FINDINGS: FFRct analysis was performed on the original cardiac CT angiogram
dataset. Diagrammatic representation of the FFRct analysis is
provided in a separate PDF document in PACS. This dictation was
created using the PDF document and an interactive 3D model of the
results. 3D model is not available in the EMR/PACS. Normal FFR range
is >0.80.

1. Left Main: FFR - 0.99 - no significant stenosis.

2. LAD: Prox 0.99, mid 0.87, distal 0.78.  No significant stenosis.
3. LCX: Prox 0.99, mid 0.98, distal - not calculated. No significant
stenosis.
4. RCA: Prox 0.98, mid 0.83, distal 0.82.  No significant stenosis.
IMPRESSION: 1. CT FFR analysis didn't show any hemodynamically significant
stenosis.

*** End of Addendum ***
Addendum:
EXAM:
OVER-READ INTERPRETATION  CT CHEST

The following report is an over-read performed by radiologist Dr.
Yusor Pharmacist [REDACTED] on 11/13/2020. This
over-read does not include interpretation of cardiac or coronary
anatomy or pathology. The coronary calcium score/coronary CTA
interpretation by the cardiologist is attached.
FINDINGS: Extracardiac findings will be described separately under dictation
for contemporaneously obtained chest CTA.
IMPRESSION: Please see separate dictation for contemporaneously obtained chest
CTA 11/13/2020 for full description of relevant extracardiac
findings.
FINDINGS: A 110 kV prospective scan was triggered in the descending thoracic
aorta at 111 HU's. Axial non-contrast 3 mm slices were carried out
through the heart. The data set was analyzed on a dedicated work
station and scored using the Agatson method. Gantry rotation speed
was 250 msecs and collimation was .6 mm. No beta blockade and 0.8 mg
of sl NTG was given. The 3D data set was reconstructed in 5%
intervals of the 67-82 % of the R-R cycle. Diastolic phases were
analyzed on a dedicated work station using MPR, MIP and VRT modes.
The patient received 80 cc of contrast.

Aorta: Ectasia of the ascending aortae noted. Maximal diameter 41
mm. Mild calcifications noted in ascending aortae, aortic arch and
descending thoracic aortae. No dissection.

Aortic Valve:  Trileaflet.  Mild calcifications noted.

Coronary Arteries:  Normal coronary origin.  Right dominance.

RCA is a large dominant artery that gives rise to PDA and PLA. There
is large, mixed plaque with moderate stenosis (50-69%) in the mid
portion of this artery.

Left main is a large artery that gives rise to LAD, intermediate
branch and LCX arteries. There are calcified plaques noted in this
artery with mild non-obstructive stenosis (25-49%).

LAD is a large vessel that has mixed plaque in its mid portion that
cause moderate stenosis (50-69%).This artery gives rise to small D1.

Intermediate branch has calcified plaque in its proximal portion
with mild stenosis.

LCX is a non-dominant artery that gives rise to one moderate size
OM1 branch. There is no plaque.

Other findings:

Normal pulmonary vein drainage into the left atrium.

Normal left atrial appendage without a thrombus.

Normal size of the pulmonary artery.
IMPRESSION: 1. Coronary calcium score of 462.

2. Normal coronary origin with right dominance.

3. CAD-RADS 3. Moderate stenosis. Consider symptom-guided
anti-ischemic pharmacotherapy as well as risk factor modification
per guideline directed care. Additional analysis with CT FFR will be
submitted.

*** End of Addendum ***
EXAM:
OVER-READ INTERPRETATION  CT CHEST

The following report is an over-read performed by radiologist Dr.
Yusor Pharmacist [REDACTED] on 11/13/2020. This
over-read does not include interpretation of cardiac or coronary
anatomy or pathology. The coronary calcium score/coronary CTA
interpretation by the cardiologist is attached.
FINDINGS: Extracardiac findings will be described separately under dictation
for contemporaneously obtained chest CTA.
IMPRESSION: Please see separate dictation for contemporaneously obtained chest
CTA 11/13/2020 for full description of relevant extracardiac
findings.

## 2023-07-26 DIAGNOSIS — G8929 Other chronic pain: Secondary | ICD-10-CM | POA: Diagnosis not present

## 2023-07-26 DIAGNOSIS — N390 Urinary tract infection, site not specified: Secondary | ICD-10-CM | POA: Diagnosis not present

## 2023-07-26 DIAGNOSIS — M25561 Pain in right knee: Secondary | ICD-10-CM | POA: Diagnosis not present

## 2023-07-26 DIAGNOSIS — R300 Dysuria: Secondary | ICD-10-CM | POA: Diagnosis not present

## 2023-08-14 DIAGNOSIS — Z6824 Body mass index (BMI) 24.0-24.9, adult: Secondary | ICD-10-CM | POA: Diagnosis not present

## 2023-08-14 DIAGNOSIS — J40 Bronchitis, not specified as acute or chronic: Secondary | ICD-10-CM | POA: Diagnosis not present

## 2023-08-14 DIAGNOSIS — J329 Chronic sinusitis, unspecified: Secondary | ICD-10-CM | POA: Diagnosis not present

## 2023-10-24 DIAGNOSIS — L578 Other skin changes due to chronic exposure to nonionizing radiation: Secondary | ICD-10-CM | POA: Diagnosis not present

## 2023-10-24 DIAGNOSIS — C44622 Squamous cell carcinoma of skin of right upper limb, including shoulder: Secondary | ICD-10-CM | POA: Diagnosis not present

## 2023-10-24 DIAGNOSIS — L820 Inflamed seborrheic keratosis: Secondary | ICD-10-CM | POA: Diagnosis not present

## 2023-10-24 DIAGNOSIS — L821 Other seborrheic keratosis: Secondary | ICD-10-CM | POA: Diagnosis not present

## 2023-11-24 DIAGNOSIS — R300 Dysuria: Secondary | ICD-10-CM | POA: Diagnosis not present

## 2023-11-24 DIAGNOSIS — N390 Urinary tract infection, site not specified: Secondary | ICD-10-CM | POA: Diagnosis not present

## 2023-12-05 DIAGNOSIS — M25561 Pain in right knee: Secondary | ICD-10-CM | POA: Diagnosis not present

## 2023-12-05 DIAGNOSIS — R102 Pelvic and perineal pain: Secondary | ICD-10-CM | POA: Diagnosis not present

## 2023-12-05 DIAGNOSIS — G8929 Other chronic pain: Secondary | ICD-10-CM | POA: Diagnosis not present

## 2023-12-08 DIAGNOSIS — K8020 Calculus of gallbladder without cholecystitis without obstruction: Secondary | ICD-10-CM | POA: Diagnosis not present

## 2023-12-08 DIAGNOSIS — K5900 Constipation, unspecified: Secondary | ICD-10-CM | POA: Diagnosis not present

## 2023-12-10 DIAGNOSIS — K5900 Constipation, unspecified: Secondary | ICD-10-CM | POA: Diagnosis not present

## 2024-02-12 DIAGNOSIS — H1840 Unspecified corneal degeneration: Secondary | ICD-10-CM | POA: Diagnosis not present

## 2024-02-12 DIAGNOSIS — H52223 Regular astigmatism, bilateral: Secondary | ICD-10-CM | POA: Diagnosis not present

## 2024-02-12 DIAGNOSIS — Z961 Presence of intraocular lens: Secondary | ICD-10-CM | POA: Diagnosis not present

## 2024-02-12 DIAGNOSIS — Z9849 Cataract extraction status, unspecified eye: Secondary | ICD-10-CM | POA: Diagnosis not present

## 2024-02-12 DIAGNOSIS — H3530 Unspecified macular degeneration: Secondary | ICD-10-CM | POA: Diagnosis not present

## 2024-02-12 DIAGNOSIS — H353131 Nonexudative age-related macular degeneration, bilateral, early dry stage: Secondary | ICD-10-CM | POA: Diagnosis not present

## 2024-02-12 DIAGNOSIS — H524 Presbyopia: Secondary | ICD-10-CM | POA: Diagnosis not present

## 2024-02-12 DIAGNOSIS — H5203 Hypermetropia, bilateral: Secondary | ICD-10-CM | POA: Diagnosis not present
